# Patient Record
Sex: Female | Born: 1960 | Race: White | Hispanic: No
Health system: Southern US, Community
[De-identification: ages and names within clinical notes are randomized; demographics above are authoritative.]

## PROBLEM LIST (undated history)

## (undated) DIAGNOSIS — F0280 Dementia in other diseases classified elsewhere without behavioral disturbance: Secondary | ICD-10-CM

## (undated) DIAGNOSIS — R569 Unspecified convulsions: Secondary | ICD-10-CM

## (undated) DIAGNOSIS — E785 Hyperlipidemia, unspecified: Secondary | ICD-10-CM

## (undated) DIAGNOSIS — G309 Alzheimer's disease, unspecified: Secondary | ICD-10-CM

## (undated) HISTORY — PX: ABDOMINAL HYSTERECTOMY: SHX81

---

## 2006-10-21 ENCOUNTER — Encounter: Payer: Self-pay | Admitting: Unknown Physician Specialty

## 2006-11-06 ENCOUNTER — Encounter: Payer: Self-pay | Admitting: Unknown Physician Specialty

## 2006-12-07 ENCOUNTER — Encounter: Payer: Self-pay | Admitting: Unknown Physician Specialty

## 2007-01-06 ENCOUNTER — Encounter: Payer: Self-pay | Admitting: Unknown Physician Specialty

## 2011-07-22 ENCOUNTER — Ambulatory Visit: Payer: Self-pay | Admitting: Internal Medicine

## 2012-03-06 ENCOUNTER — Ambulatory Visit: Payer: Self-pay | Admitting: Internal Medicine

## 2013-09-17 ENCOUNTER — Ambulatory Visit: Payer: Self-pay | Admitting: Family Medicine

## 2013-10-01 ENCOUNTER — Ambulatory Visit: Payer: Self-pay | Admitting: Family Medicine

## 2013-10-11 DIAGNOSIS — E785 Hyperlipidemia, unspecified: Secondary | ICD-10-CM | POA: Insufficient documentation

## 2014-03-28 ENCOUNTER — Ambulatory Visit: Payer: Self-pay | Admitting: Gastroenterology

## 2014-03-29 LAB — PATHOLOGY REPORT

## 2014-05-06 ENCOUNTER — Ambulatory Visit: Payer: Self-pay | Admitting: Neurology

## 2015-03-14 ENCOUNTER — Other Ambulatory Visit: Payer: Self-pay | Admitting: Family Medicine

## 2015-03-14 DIAGNOSIS — Z1231 Encounter for screening mammogram for malignant neoplasm of breast: Secondary | ICD-10-CM

## 2015-03-15 ENCOUNTER — Ambulatory Visit
Admission: RE | Admit: 2015-03-15 | Discharge: 2015-03-15 | Disposition: A | Discharge status: Home / Self Care | Payer: No Typology Code available for payment source | Source: Ambulatory Visit | Attending: Family Medicine | Admitting: Family Medicine

## 2015-03-15 DIAGNOSIS — Z1231 Encounter for screening mammogram for malignant neoplasm of breast: Secondary | ICD-10-CM | POA: Insufficient documentation

## 2016-04-03 ENCOUNTER — Other Ambulatory Visit: Payer: Self-pay | Admitting: Obstetrics and Gynecology

## 2016-04-03 DIAGNOSIS — Z1231 Encounter for screening mammogram for malignant neoplasm of breast: Secondary | ICD-10-CM

## 2016-04-08 ENCOUNTER — Ambulatory Visit
Admission: RE | Admit: 2016-04-08 | Discharge: 2016-04-08 | Disposition: A | Discharge status: Home / Self Care | Payer: BLUE CROSS/BLUE SHIELD | Source: Ambulatory Visit | Attending: Obstetrics and Gynecology | Admitting: Obstetrics and Gynecology

## 2016-04-08 DIAGNOSIS — Z1231 Encounter for screening mammogram for malignant neoplasm of breast: Secondary | ICD-10-CM | POA: Insufficient documentation

## 2016-04-08 DIAGNOSIS — R928 Other abnormal and inconclusive findings on diagnostic imaging of breast: Secondary | ICD-10-CM | POA: Insufficient documentation

## 2018-07-26 ENCOUNTER — Emergency Department: Payer: Medicare PPO

## 2018-07-26 ENCOUNTER — Emergency Department
Admission: EM | Admit: 2018-07-26 | Discharge: 2018-07-26 | Disposition: A | Discharge status: Other Acute Inpatient Hospital | Payer: Medicare PPO | Attending: Emergency Medicine | Admitting: Emergency Medicine

## 2018-07-26 DIAGNOSIS — R569 Unspecified convulsions: Secondary | ICD-10-CM | POA: Insufficient documentation

## 2018-07-26 DIAGNOSIS — F0390 Unspecified dementia without behavioral disturbance: Secondary | ICD-10-CM | POA: Insufficient documentation

## 2018-07-26 DIAGNOSIS — G309 Alzheimer's disease, unspecified: Secondary | ICD-10-CM | POA: Diagnosis not present

## 2018-07-26 DIAGNOSIS — Z79899 Other long term (current) drug therapy: Secondary | ICD-10-CM | POA: Insufficient documentation

## 2018-07-26 HISTORY — DX: Dementia in other diseases classified elsewhere, unspecified severity, without behavioral disturbance, psychotic disturbance, mood disturbance, and anxiety: F02.80

## 2018-07-26 HISTORY — DX: Hyperlipidemia, unspecified: E78.5

## 2018-07-26 HISTORY — DX: Alzheimer's disease, unspecified: G30.9

## 2018-07-26 LAB — CBC WITH DIFFERENTIAL/PLATELET
ABS IMMATURE GRANULOCYTES: 0.05 10*3/uL (ref 0.00–0.07)
BASOS ABS: 0 10*3/uL (ref 0.0–0.1)
BASOS PCT: 0 %
EOS ABS: 0.1 10*3/uL (ref 0.0–0.5)
EOS PCT: 2 %
HEMATOCRIT: 43.2 % (ref 36.0–46.0)
HEMOGLOBIN: 14.1 g/dL (ref 12.0–15.0)
IMMATURE GRANULOCYTES: 1 %
Lymphocytes Relative: 31 %
Lymphs Abs: 1.8 10*3/uL (ref 0.7–4.0)
MCH: 30.1 pg (ref 26.0–34.0)
MCHC: 32.6 g/dL (ref 30.0–36.0)
MCV: 92.3 fL (ref 80.0–100.0)
MONO ABS: 0.4 10*3/uL (ref 0.1–1.0)
Monocytes Relative: 7 %
NRBC: 0 % (ref 0.0–0.2)
Neutro Abs: 3.3 10*3/uL (ref 1.7–7.7)
Neutrophils Relative %: 59 %
Platelets: 193 10*3/uL (ref 150–400)
RBC: 4.68 MIL/uL (ref 3.87–5.11)
RDW: 11.9 % (ref 11.5–15.5)
WBC: 5.7 10*3/uL (ref 4.0–10.5)

## 2018-07-26 LAB — URINALYSIS, COMPLETE (UACMP) WITH MICROSCOPIC
Bacteria, UA: NONE SEEN
Bilirubin Urine: NEGATIVE
GLUCOSE, UA: NEGATIVE mg/dL
KETONES UR: NEGATIVE mg/dL
Leukocytes, UA: NEGATIVE
Nitrite: NEGATIVE
PROTEIN: NEGATIVE mg/dL
Specific Gravity, Urine: 1.011 (ref 1.005–1.030)
pH: 5 (ref 5.0–8.0)

## 2018-07-26 LAB — COMPREHENSIVE METABOLIC PANEL
ALBUMIN: 4 g/dL (ref 3.5–5.0)
ALT: 16 U/L (ref 0–44)
AST: 32 U/L (ref 15–41)
Alkaline Phosphatase: 54 U/L (ref 38–126)
Anion gap: 10 (ref 5–15)
BILIRUBIN TOTAL: 0.7 mg/dL (ref 0.3–1.2)
BUN: 14 mg/dL (ref 6–20)
CO2: 21 mmol/L — ABNORMAL LOW (ref 22–32)
CREATININE: 0.91 mg/dL (ref 0.44–1.00)
Calcium: 9 mg/dL (ref 8.9–10.3)
Chloride: 107 mmol/L (ref 98–111)
GFR calc Af Amer: >60 mL/min (ref >60)
GLUCOSE: 115 mg/dL — AB (ref 70–99)
POTASSIUM: 4.5 mmol/L (ref 3.5–5.1)
Sodium: 138 mmol/L (ref 135–145)
TOTAL PROTEIN: 7.2 g/dL (ref 6.5–8.1)

## 2018-07-26 MED ORDER — LEVETIRACETAM IN NACL 1500 MG/100ML IV SOLN
1500.0000 mg | Freq: Once | INTRAVENOUS | Status: AC
  Administered 2018-07-26: 1500 mg via INTRAVENOUS
  Filled 2018-07-26: qty 100

## 2018-07-26 MED ORDER — LEVETIRACETAM 500 MG PO TABS: 500.0000 mg | ORAL_TABLET | Freq: Two times a day (BID) | ORAL | 0 refills | Status: AC

## 2018-07-26 NOTE — ED Notes (Signed)
Pt resting at this time  Family remains at bedside

## 2018-07-26 NOTE — ED Triage Notes (Signed)
Patient coming from [facility] for unwitnessed fall. Patient found on floor, convulsing, foaming from mouth, blood present in mouth per facility. EMS did not visualize blood in mouth, however found it on pillow. Patient has hx of early onset alzheimer's.   EMS vitals: CBG 144, 124/70

## 2018-07-26 NOTE — ED Notes (Signed)
Patient very upset continues to cry and state she is scared and sorry.AS

## 2018-07-26 NOTE — ED Notes (Signed)
Report given to mebane ridge at this time regarding new medication and hospital visit.

## 2018-07-26 NOTE — Discharge Instructions (Signed)
Please begin taking Keppra twice a day as prescribed and make an appointment to follow-up with your neurologist within 1 week for recheck.  Return to the emergency department sooner for any concerns.  It was a pleasure to take care of you today, and thank you for coming to our emergency department.  If you have any questions or concerns before leaving please ask the nurse to grab me and I'm more than happy to go through your aftercare instructions again.  If you have any concerns once you are home that you are not improving or are in fact getting worse before you can make it to your follow-up appointment, please do not hesitate to call 911 and come back for further evaluation.  Merrily Brittle, MD  Results for orders placed or performed during the hospital encounter of 07/26/18  CBC with Differential  Result Value Ref Range   WBC 5.7 4.0 - 10.5 K/uL   RBC 4.68 3.87 - 5.11 MIL/uL   Hemoglobin 14.1 12.0 - 15.0 g/dL   HCT 16.0 10.9 - 32.3 %   MCV 92.3 80.0 - 100.0 fL   MCH 30.1 26.0 - 34.0 pg   MCHC 32.6 30.0 - 36.0 g/dL   RDW 55.7 32.2 - 02.5 %   Platelets 193 150 - 400 K/uL   nRBC 0.0 0.0 - 0.2 %   Neutrophils Relative % 59 %   Neutro Abs 3.3 1.7 - 7.7 K/uL   Lymphocytes Relative 31 %   Lymphs Abs 1.8 0.7 - 4.0 K/uL   Monocytes Relative 7 %   Monocytes Absolute 0.4 0.1 - 1.0 K/uL   Eosinophils Relative 2 %   Eosinophils Absolute 0.1 0.0 - 0.5 K/uL   Basophils Relative 0 %   Basophils Absolute 0.0 0.0 - 0.1 K/uL   Immature Granulocytes 1 %   Abs Immature Granulocytes 0.05 0.00 - 0.07 K/uL  Comprehensive metabolic panel  Result Value Ref Range   Sodium 138 135 - 145 mmol/L   Potassium 4.5 3.5 - 5.1 mmol/L   Chloride 107 98 - 111 mmol/L   CO2 21 (L) 22 - 32 mmol/L   Glucose, Bld 115 (H) 70 - 99 mg/dL   BUN 14 6 - 20 mg/dL   Creatinine, Ser 4.27 0.44 - 1.00 mg/dL   Calcium 9.0 8.9 - 06.2 mg/dL   Total Protein 7.2 6.5 - 8.1 g/dL   Albumin 4.0 3.5 - 5.0 g/dL   AST 32 15 - 41 U/L     ALT 16 0 - 44 U/L   Alkaline Phosphatase 54 38 - 126 U/L   Total Bilirubin 0.7 0.3 - 1.2 mg/dL   GFR calc non Af Amer >60 >60 mL/min   GFR calc Af Amer >60 >60 mL/min   Anion gap 10 5 - 15  Urinalysis, Complete w Microscopic  Result Value Ref Range   Color, Urine YELLOW (A) YELLOW   APPearance CLOUDY (A) CLEAR   Specific Gravity, Urine 1.011 1.005 - 1.030   pH 5.0 5.0 - 8.0   Glucose, UA NEGATIVE NEGATIVE mg/dL   Hgb urine dipstick SMALL (A) NEGATIVE   Bilirubin Urine NEGATIVE NEGATIVE   Ketones, ur NEGATIVE NEGATIVE mg/dL   Protein, ur NEGATIVE NEGATIVE mg/dL   Nitrite NEGATIVE NEGATIVE   Leukocytes, UA NEGATIVE NEGATIVE   RBC / HPF 0-5 0 - 5 RBC/hpf   WBC, UA 0-5 0 - 5 WBC/hpf   Bacteria, UA NONE SEEN NONE SEEN   Squamous Epithelial / LPF 6-10 0 -  5   Hyaline Casts, UA PRESENT    Ct Head Wo Contrast  Result Date: 07/26/2018 CLINICAL DATA:  Initial evaluation for acute seizure. EXAM: CT HEAD WITHOUT CONTRAST TECHNIQUE: Contiguous axial images were obtained from the base of the skull through the vertex without intravenous contrast. COMPARISON:  Prior MRI from 05/06/2014. FINDINGS: Brain: Mildly advanced age-related cerebral atrophy. No acute intracranial hemorrhage. No acute large vessel territory infarct. No mass lesion, midline shift or mass effect. Prominent ventriculomegaly involving the lateral ventricles, mildly worsened relative to previous brain MRI from 2015, and somewhat out of proportion to cortical sulcation. Third and fourth ventricles relatively normal in size. No obstructive lesion identified. Periventricular hypodensity could reflect chronic microvascular ischemic disease and/or transependymal flow of CSF. Vascular: No hyperdense vessel. Scattered vascular calcifications noted within the carotid siphons. Skull: Scalp soft tissues and calvarium within normal limits. Sinuses/Orbits: Globes and orbital soft tissues within normal limits. Paranasal sinuses are clear. Trace  bilateral mastoid effusions, of doubtful significance. Other: None. IMPRESSION: 1. Ventriculomegaly, somewhat out of proportion relative to cortical sulcation, and mildly worsened relative to previous brain MRI from 2015. While this finding could be related to underlying cerebral atrophy, NPH could also have this appearance. Sequelae of Alzheimer's dementia could also be considered. Periventricular hypodensity could reflect sequelae of chronic microvascular ischemic disease and/or transependymal flow of CSF. 2. No other acute intracranial abnormality. Electronically Signed   By: Rise MuBenjamin  McClintock M.D.   On: 07/26/2018 04:41   Dg Chest Port 1 View  Result Date: 07/26/2018 CLINICAL DATA:  Initial evaluation for acute shortness of breath. EXAM: PORTABLE CHEST 1 VIEW COMPARISON:  None available. FINDINGS: Mild cardiomegaly.  Mediastinal silhouette normal. Lungs mildly hypoinflated. Perihilar vascular congestion without overt pulmonary edema. No consolidative opacity. No pleural effusion. No pneumothorax. No acute osseous finding. IMPRESSION: 1. Cardiomegaly with mild perihilar vascular congestion without overt edema. 2. No other active cardiopulmonary disease. Electronically Signed   By: Rise MuBenjamin  McClintock M.D.   On: 07/26/2018 04:09

## 2018-07-26 NOTE — ED Notes (Signed)
Patient transported to CT 

## 2018-07-26 NOTE — ED Provider Notes (Addendum)
[facility] Emergency Department Provider Note  ____________________________________________   First MD Initiated Contact with Patient 07/26/18 0354     (approximate)  I have reviewed the triage vital signs and the nursing notes.   HISTORY  Chief Complaint Fall  Level 5 exemption history is limited by the patient's dementia  HPI Christine Oconnell is a 58 y.o. female comes to the emergency department via EMS after apparently having a first lifetime generalized tonic-clonic seizure shortly prior to arrival.  History obtained from the patient's son whose girlfriend was present at the time of the event.  The patient has a long history of early onset dementia and has never had a seizure in her life.  Apparently this evening she got up to go to the bathroom and while in the bathroom had a generalized tonic-clonic seizure and "foaming at the mouth" and was "out of it" for 15 to 20 minutes.  EMS noted normal blood sugar in route and according to family the patient is currently behaving normally.    Past Medical History:  Diagnosis Date  . Alzheimer's dementia (HCC)   . Hyperlipidemia     There are no active problems to display for this patient.   Past Surgical History:  Procedure Laterality Date  . ABDOMINAL HYSTERECTOMY      Prior to Admission medications   Medication Sig Start Date End Date Taking? Authorizing Provider  ascorbic acid (VITAMIN C) 250 MG CHEW Chew 500 mg by mouth daily.    [provider]  escitalopram (LEXAPRO) 10 MG tablet Take 10 mg by mouth daily.  07/07/18   [provider]  levETIRAcetam (KEPPRA) 500 MG tablet Take 1 tablet (500 mg total) by mouth 2 (two) times daily. 07/26/18   Merrily Brittle, MD  lovastatin (MEVACOR) 20 MG tablet Take 20 mg by mouth daily at 6 PM.  06/01/18   [provider]  memantine (NAMENDA) 10 MG tablet Take 10 mg by mouth 2 (two) times daily.  05/04/18   [provider]    QUEtiapine (SEROQUEL) 25 MG tablet Take 25 mg by mouth 2 (two) times daily.  06/04/18   [provider]  vitamin B-12 (CYANOCOBALAMIN) 1000 MCG tablet Take 1,000 mcg by mouth daily.     [provider]    Allergies Patient has no known allergies.  Family History  Problem Relation Age of Onset  . Breast cancer Maternal Grandmother 85       mat great gm  . Breast cancer Cousin 92    Social History Social History   Tobacco Use  . Smoking status: Unknown If Ever Smoked  Substance Use Topics  . Alcohol use: Not Currently  . Drug use: Not on file    Review of Systems Level 5 exemption history is limited by the patient's profound dementia  ____________________________________________   PHYSICAL EXAM:  VITAL SIGNS: ED Triage Vitals  Enc Vitals Group     BP      Pulse      Resp      Temp      Temp src      SpO2      Weight      Height      Head Circumference      Peak Flow      Pain Score      Pain Loc      Pain Edu?      Excl. in GC?     Constitutional: Pleasant  and cooperative.  Appears somewhat confused with profound dementia Eyes: PERRL EOMI. midrange and brisk Head: Atraumatic. Nose: No congestion/rhinnorhea. Mouth/Throat: No trismus Neck: No stridor.  No meningismus Cardiovascular: Normal rate, regular rhythm. Grossly normal heart sounds.  Good peripheral circulation. Respiratory: Slightly increased respiratory effort.  No retractions. Lungs CTAB and moving good air Gastrointestinal: Soft nontender Musculoskeletal: No lower extremity edema   Neurologic:  No gross focal neurologic deficits are appreciated. Skin:  Skin is warm, dry and intact. No rash noted.  No diaphoresis Psychiatric: Profound dementia    ____________________________________________   DIFFERENTIAL includes but not limited to  Intracerebral hemorrhage, stroke, nonaccidental trauma, dehydration, metabolic derangement, arrhythmia, urinary tract  infection ____________________________________________   LABS (all labs ordered are listed, but only abnormal results are displayed)  Labs Reviewed  COMPREHENSIVE METABOLIC PANEL - Abnormal; Notable for the following components:      Result Value   CO2 21 (*)    Glucose, Bld 115 (*)    All other components within normal limits  URINALYSIS, COMPLETE (UACMP) WITH MICROSCOPIC - Abnormal; Notable for the following components:   Color, Urine YELLOW (*)    APPearance CLOUDY (*)    Hgb urine dipstick SMALL (*)    All other components within normal limits  CBC WITH DIFFERENTIAL/PLATELET    Lab work reviewed by me with no clear etiology of the patient's symptoms identified __________________________________________  EKG  ED ECG REPORT I, Merrily BrittleNeil Sindy Mccune, the attending physician, personally viewed and interpreted this ECG.  Date: 07/26/2018 EKG Time:  Rate: 65 Rhythm: normal sinus rhythm QRS Axis: normal Intervals: normal ST/T Wave abnormalities: normal Narrative Interpretation: no evidence of acute ischemia  ____________________________________________  RADIOLOGY  Chest x-ray reviewed by me shows cardiomegaly otherwise unremarkable CT scan of the head reviewed by me shows slightly increased ventriculomegaly compared to MRI in 2015 ____________________________________________   PROCEDURES  Procedure(s) performed: no  .Critical Care Performed by: Merrily Brittleifenbark, Nakayla Rorabaugh, MD Authorized by: Merrily Brittleifenbark, Mehtaab Mayeda, MD   Critical care provider statement:    Critical care time (minutes):  30   Critical care time was exclusive of:  Separately billable procedures and treating other patients   Critical care was necessary to treat or prevent imminent or life-threatening deterioration of the following conditions:  CNS failure or compromise   Critical care was time spent personally by me on the following activities:  Development of treatment plan with patient or surrogate, discussions with  consultants, evaluation of patient's response to treatment, examination of patient, obtaining history from patient or surrogate, ordering and performing treatments and interventions, ordering and review of laboratory studies, ordering and review of radiographic studies, pulse oximetry, re-evaluation of patient's condition and review of old charts    Critical Care performed: no  ____________________________________________   INITIAL IMPRESSION / ASSESSMENT AND PLAN / ED COURSE  Pertinent labs & imaging results that were available during my care of the patient were reviewed by me and considered in my medical decision making (see chart for details).   As part of my medical decision making, I reviewed the following data within the electronic MEDICAL RECORD NUMBER History obtained from family if available, nursing notes, old chart and ekg, as well as notes from prior ED visits.  The patient comes to the emergency department after a first lifetime generalized tonic-clonic seizure.  On discussion it sounds like the patient's full body convulsed she was obtunded and had a postictal period.  As this is her first lifetime seizure I will add on a head  CT in addition to labs including urinalysis.  I did consider arrhythmia such as ventricular tachycardia and will keep her on monitor for several hours to observe.  EKG with no concerning signs.  The patient's head CT shows increased ventriculomegaly compared to previous concerning for possible normal pressure hydrocephalus versus worsening dementia.  I discussed with [facility] neurologist (family request) Dr. Regino Schultze and we reviewed the patient's CT read in her presentation.  He felt that her last neuroimaging was 5 years ago and the changes present likely represent continued deterioration and not normal pressure hydrocephalus.  I told him I did load the patient on Keppra which she felt was reasonable and felt that it would be reasonable to either  start her on Keppra or not.  Given the patient's dementia and new symptoms I have opted to start on Keppra prescribed a one-month supply and will refer her back to Dr. Sherryll Burger for continued outpatient management.  The patient is currently at her baseline and family understands and is very comfortable with the plan.  Return precautions have been given.      ____________________________________________   FINAL CLINICAL IMPRESSION(S) / ED DIAGNOSES  Final diagnoses:  Seizure (HCC)      NEW MEDICATIONS STARTED DURING THIS VISIT:  Discharge Medication List as of 07/26/2018  6:35 AM    START taking these medications   Details  levETIRAcetam (KEPPRA) 500 MG tablet Take 1 tablet (500 mg total) by mouth 2 (two) times daily., Starting Sun 07/26/2018, Print         Note:  This document was prepared using Dragon voice recognition software and may include unintentional dictation errors.    Merrily Brittle, MD 07/26/18 2023    Merrily Brittle, MD 08/02/18 [phone]

## 2018-08-22 ENCOUNTER — Other Ambulatory Visit: Payer: Self-pay

## 2018-08-22 ENCOUNTER — Ambulatory Visit
Admission: EM | Admit: 2018-08-22 | Discharge: 2018-08-22 | Disposition: A | Discharge status: Home / Self Care | Payer: Medicare PPO | Attending: Emergency Medicine | Admitting: Emergency Medicine

## 2018-08-22 ENCOUNTER — Encounter: Payer: Self-pay | Admitting: Gynecology

## 2018-08-22 DIAGNOSIS — R451 Restlessness and agitation: Secondary | ICD-10-CM

## 2018-08-22 DIAGNOSIS — N898 Other specified noninflammatory disorders of vagina: Secondary | ICD-10-CM

## 2018-08-22 LAB — URINALYSIS, COMPLETE (UACMP) WITH MICROSCOPIC
Bilirubin Urine: NEGATIVE
GLUCOSE, UA: NEGATIVE mg/dL
Ketones, ur: NEGATIVE mg/dL
Leukocytes,Ua: NEGATIVE
Nitrite: NEGATIVE
PH: 5.5 (ref 5.0–8.0)
PROTEIN: NEGATIVE mg/dL
Specific Gravity, Urine: 1.025 (ref 1.005–1.030)

## 2018-08-22 MED ORDER — FLUCONAZOLE 150 MG PO TABS: 150.0000 mg | ORAL_TABLET | Freq: Once | ORAL | 0 refills | Status: AC

## 2018-08-22 NOTE — ED Triage Notes (Signed)
Per mother , daughter possible uti.

## 2018-08-22 NOTE — Discharge Instructions (Addendum)
She may have a yeast infection causing her agitation, will try the Diflucan.  she needs to be in clean, dry depends- check frequently to make sure that she is not sitting in a wet diaper.

## 2018-08-22 NOTE — ED Provider Notes (Signed)
HPI  SUBJECTIVE:  Christine Oconnell is a 58 y.o. female who presents with two episodes of violent agitation this morning while nursing home aides were trying to clean her after patient had  a bowel movement.  mother states that the patient has been doing this for a long time.  No other change in her behavior, no vomiting, fevers.  Patient is eating and drinking well.  No medical, back pain, no pain with defecation.  No melena, hematochezia.  Patient wears depends, she is not able to tell when she needs to urinate, she does not use the restroom to urinate.  Mother states that she often finds the patient sitting in a wet diaper.  When mother cleaned the patient today she did not notice any perianal or vaginal irritation.  No odorous urine, hematuria.  No apparent vaginal itching or scratching.  Mother reports vaginal discharge.  Patient was treated for UTI 10 days ago with Bactrim for 5 days.  Last dose was on 2/11.  No other antibiotics in the past month.  Past medical history of early onset Alzheimer's dementia, hyperlipidemia.  She is in a nursing home.  Patient is verbal, but does not respond to questions appropriately.  All history obtained from the mother.  Patient is at her baseline mental status per mother.  Past Medical History:  Diagnosis Date  . Alzheimer's dementia (HCC)   . Hyperlipidemia     Past Surgical History:  Procedure Laterality Date  . ABDOMINAL HYSTERECTOMY      Family History  Problem Relation Age of Onset  . Breast cancer Maternal Grandmother 85       mat great gm  . Breast cancer Cousin 70    Social History   Tobacco Use  . Smoking status: Unknown If Ever Smoked  . Smokeless tobacco: Never Used  Substance Use Topics  . Alcohol use: Not Currently  . Drug use: Never    No current facility-administered medications for this encounter.   Current Outpatient Medications:  .  ascorbic acid (VITAMIN C) 250 MG CHEW, Chew 500 mg by mouth daily., Disp: , Rfl:  .   escitalopram (LEXAPRO) 10 MG tablet, Take 10 mg by mouth daily. , Disp: , Rfl:  .  levETIRAcetam (KEPPRA) 500 MG tablet, Take 1 tablet (500 mg total) by mouth 2 (two) times daily., Disp: 60 tablet, Rfl: 0 .  lovastatin (MEVACOR) 20 MG tablet, Take 20 mg by mouth daily at 6 PM. , Disp: , Rfl:  .  memantine (NAMENDA) 10 MG tablet, Take 10 mg by mouth 2 (two) times daily. , Disp: , Rfl:  .  QUEtiapine (SEROQUEL) 25 MG tablet, Take 25 mg by mouth 2 (two) times daily. , Disp: , Rfl:  .  fluconazole (DIFLUCAN) 150 MG tablet, Take 1 tablet (150 mg total) by mouth once for 1 dose. 1 tab po x 1. May repeat in 72 hours if no improvement, Disp: 2 tablet, Rfl: 0 .  vitamin B-12 (CYANOCOBALAMIN) 1000 MCG tablet, Take 1,000 mcg by mouth daily. , Disp: , Rfl:   No Known Allergies   ROS  As noted in HPI.   Physical Exam  BP 130/88 (BP Location: Left Arm)   Pulse 64   Temp 97.9 F (36.6 C) (Oral)   Resp 16   Wt 77.1 kg   SpO2 100%   Constitutional: Well developed, well nourished, no acute distress Eyes: PERRL, EOMI, conjunctiva normal bilaterally HENT: Normocephalic, atraumatic,mucus membranes moist Respiratory: Clear to auscultation bilaterally,  no rales, no wheezing, no rhonchi Cardiovascular: Normal rate and rhythm, no murmurs, no gallops, no rubs GI: Soft, nondistended, normal bowel sounds, nontender, no rebound, no guarding Back: no CVAT skin: No rash, skin intact Musculoskeletal: No edema, no tenderness, no deformities Neurologic: Alert & oriented x 3, CN II-XII grossly intact, no motor deficits, sensation grossly intact Psychiatric: behavior appropriate.  Calm.  Responds to simple commands.  Does not respond to questions appropriately.   ED Course   Medications - No data to display  Orders Placed This Encounter  Procedures  . Urine culture    Standing Status:   Standing    Number of Occurrences:   1  . Urinalysis, Complete w Microscopic    Standing Status:   Standing    Number  of Occurrences:   1   Results for orders placed or performed during the hospital encounter of 08/22/18 (from the past 24 hour(s))  Urinalysis, Complete w Microscopic     Status: Abnormal   Collection Time: 08/22/18  1:02 PM  Result Value Ref Range   Color, Urine YELLOW YELLOW   APPearance CLEAR CLEAR   Specific Gravity, Urine 1.025 1.005 - 1.030   pH 5.5 5.0 - 8.0   Glucose, UA NEGATIVE NEGATIVE mg/dL   Hgb urine dipstick SMALL (A) NEGATIVE   Bilirubin Urine NEGATIVE NEGATIVE   Ketones, ur NEGATIVE NEGATIVE mg/dL   Protein, ur NEGATIVE NEGATIVE mg/dL   Nitrite NEGATIVE NEGATIVE   Leukocytes,Ua NEGATIVE NEGATIVE   Squamous Epithelial / LPF 6-10 0 - 5   WBC, UA 0-5 0 - 5 WBC/hpf   RBC / HPF 0-5 0 - 5 RBC/hpf   Bacteria, UA RARE (A) NONE SEEN   No results found.  ED Clinical Impression  Agitation   ED Assessment/Plan  Unsure as to what caused the patient's outburst this morning, mother states that she frequently has these outbursts when the patient is being cleaned after having a bowel movement.  She may have a yeast vaginitis causing irritation, especially as she just finished some Bactrim for a UTI, and is often in wet depends.  Mother does note vaginal discharge.  Do not think that we will be able to perform a GU exam or get a sample here in clinic for testing, so we will treat empirically with Diflucan.  No evidence of an emergency at this point in time.  She is calm, cooperative, has normal vitals.  Her urine is initially negative for UTI, has a few bacteria and small hematuria, but will send this off for culture to confirm absence of UTI.  Discussed labs,  MDM, treatment plan, and plan for follow-up with parent Discussed sn/sx that should prompt return to the ED. parent agrees with plan.   Meds ordered this encounter  Medications  . fluconazole (DIFLUCAN) 150 MG tablet    Sig: Take 1 tablet (150 mg total) by mouth once for 1 dose. 1 tab po x 1. May repeat in 72 hours if no  improvement    Dispense:  2 tablet    Refill:  0    *This clinic note was created using Scientist, clinical (histocompatibility and immunogenetics). Therefore, there may be occasional mistakes despite careful proofreading.  ?   Domenick Gong, MD 08/22/18 [phone]

## 2018-08-24 LAB — URINE CULTURE: Culture: <10000 — AB

## 2018-09-17 ENCOUNTER — Other Ambulatory Visit: Payer: Self-pay

## 2018-09-17 ENCOUNTER — Emergency Department
Admission: EM | Admit: 2018-09-17 | Discharge: 2018-09-17 | Disposition: A | Discharge status: Home / Self Care | Payer: Medicare PPO | Attending: Emergency Medicine | Admitting: Emergency Medicine

## 2018-09-17 DIAGNOSIS — R451 Restlessness and agitation: Secondary | ICD-10-CM | POA: Diagnosis present

## 2018-09-17 DIAGNOSIS — F0391 Unspecified dementia with behavioral disturbance: Secondary | ICD-10-CM | POA: Diagnosis not present

## 2018-09-17 DIAGNOSIS — Z79899 Other long term (current) drug therapy: Secondary | ICD-10-CM | POA: Insufficient documentation

## 2018-09-17 LAB — COMPREHENSIVE METABOLIC PANEL
ALBUMIN: 4.1 g/dL (ref 3.5–5.0)
ALT: 19 U/L (ref 0–44)
AST: 26 U/L (ref 15–41)
Alkaline Phosphatase: 38 U/L (ref 38–126)
Anion gap: 6 (ref 5–15)
BUN: 16 mg/dL (ref 6–20)
CALCIUM: 9.2 mg/dL (ref 8.9–10.3)
CHLORIDE: 110 mmol/L (ref 98–111)
CO2: 27 mmol/L (ref 22–32)
Creatinine, Ser: 0.94 mg/dL (ref 0.44–1.00)
GFR calc Af Amer: >60 mL/min (ref >60)
GFR calc non Af Amer: >60 mL/min (ref >60)
GLUCOSE: 104 mg/dL — AB (ref 70–99)
POTASSIUM: 4.1 mmol/L (ref 3.5–5.1)
SODIUM: 143 mmol/L (ref 135–145)
TOTAL PROTEIN: 7.1 g/dL (ref 6.5–8.1)
Total Bilirubin: 0.6 mg/dL (ref 0.3–1.2)

## 2018-09-17 LAB — CBC
HCT: 41.9 % (ref 36.0–46.0)
Hemoglobin: 13.5 g/dL (ref 12.0–15.0)
MCH: 29.1 pg (ref 26.0–34.0)
MCHC: 32.2 g/dL (ref 30.0–36.0)
MCV: 90.3 fL (ref 80.0–100.0)
NRBC: 0 % (ref 0.0–0.2)
PLATELETS: 207 10*3/uL (ref 150–400)
RBC: 4.64 MIL/uL (ref 3.87–5.11)
RDW: 12.1 % (ref 11.5–15.5)
WBC: 5.8 10*3/uL (ref 4.0–10.5)

## 2018-09-17 LAB — URINALYSIS, COMPLETE (UACMP) WITH MICROSCOPIC
Bacteria, UA: NONE SEEN
Bilirubin Urine: NEGATIVE
Glucose, UA: NEGATIVE mg/dL
Hgb urine dipstick: NEGATIVE
KETONES UR: NEGATIVE mg/dL
Leukocytes,Ua: NEGATIVE
Nitrite: NEGATIVE
PH: 6 (ref 5.0–8.0)
Protein, ur: NEGATIVE mg/dL
SPECIFIC GRAVITY, URINE: 1.02 (ref 1.005–1.030)

## 2018-09-17 NOTE — ED Triage Notes (Signed)
Pt arrived from [facility] via EMS with complaints of pt being combative per the facility. Pt has early onset dementia. VS per EMS BP-108/85 HR-75 CBG-119 NSR O2sat-95%RA Temp-97.5. The facility thinks she could possibly have a UTI. Pt has complaints of stomach pain.

## 2018-09-17 NOTE — ED Provider Notes (Signed)
[facility] Emergency Department Provider Note  Time seen: 9:24 PM  I have reviewed the triage vital signs and the nursing notes.   HISTORY  Chief Complaint Agitation   HPI CHANNEL TEE is a 58 y.o. female with a past medical history of dementia, hyperlipidemia, presents to the emergency department for agitation at her nursing facility.  According to EMS report patient became agitated and aggressive at her nursing facility.  Per EMS staff states that patient will typically get like this when she has a urinary tract infection so they sent her to the emergency department for medical evaluation.  Currently the patient appears extremely well, she has baseline dementia cannot contribute to her history or review of systems however she is calm cooperative and pleasant at this time.  Past Medical History:  Diagnosis Date  . Alzheimer's dementia (HCC)   . Hyperlipidemia     There are no active problems to display for this patient.   Past Surgical History:  Procedure Laterality Date  . ABDOMINAL HYSTERECTOMY      Prior to Admission medications   Medication Sig Start Date End Date Taking? Authorizing Provider  ascorbic acid (VITAMIN C) 250 MG CHEW Chew 500 mg by mouth daily.    [provider]  escitalopram (LEXAPRO) 10 MG tablet Take 10 mg by mouth daily.  07/07/18   [provider]  levETIRAcetam (KEPPRA) 500 MG tablet Take 1 tablet (500 mg total) by mouth 2 (two) times daily. 07/26/18   Merrily Brittle, MD  lovastatin (MEVACOR) 20 MG tablet Take 20 mg by mouth daily at 6 PM.  06/01/18   [provider]  memantine (NAMENDA) 10 MG tablet Take 10 mg by mouth 2 (two) times daily.  05/04/18   [provider]  QUEtiapine (SEROQUEL) 25 MG tablet Take 25 mg by mouth 2 (two) times daily.  06/04/18   [provider]  vitamin B-12 (CYANOCOBALAMIN) 1000 MCG tablet Take 1,000 mcg by mouth daily.     [provider]    No  Known Allergies  Family History  Problem Relation Age of Onset  . Breast cancer Maternal Grandmother 85       mat great gm  . Breast cancer Cousin 88    Social History Social History   Tobacco Use  . Smoking status: Unknown If Ever Smoked  . Smokeless tobacco: Never Used  Substance Use Topics  . Alcohol use: Not Currently  . Drug use: Never    Review of Systems Unable to obtain an adequate/accurate review of systems secondary to baseline dementia.  ____________________________________________   PHYSICAL EXAM:  VITAL SIGNS: ED Triage Vitals  Enc Vitals Group     BP 09/17/18 2055 120/71     Pulse Rate 09/17/18 2055 77     Resp 09/17/18 2055 16     Temp 09/17/18 2055 99 F (37.2 C)     Temp Source 09/17/18 2055 Oral     SpO2 09/17/18 2055 97 %     Weight 09/17/18 2058 169 lb 15.6 oz (77.1 kg)     Height 09/17/18 2058 5\' 5"  (1.651 m)     Head Circumference --      Peak Flow --      Pain Score 09/17/18 2057 1     Pain Loc --      Pain Edu? --      Excl. in GC? --    Constitutional: Awake and alert, no distress.  Calm and cooperative.  Pleasant currently. Eyes: Normal exam ENT   Head: Normocephalic and atraumatic.   Mouth/Throat: Mucous membranes are moist. Cardiovascular: Normal rate, regular rhythm. Respiratory: Normal respiratory effort without tachypnea nor retractions. Breath sounds are clear  Gastrointestinal: Soft and nontender. No distention.   Musculoskeletal: Nontender with normal range of motion in all extremities.  Neurologic:  Normal speech and language. No gross focal neurologic deficits Skin:  Skin is warm, dry and intact.  Psychiatric: Mood and affect are normal.     INITIAL IMPRESSION / ASSESSMENT AND PLAN / ED COURSE  Pertinent labs & imaging results that were available during my care of the patient were reviewed by me and considered in my medical decision making (see chart for details).  Patient presents to the emergency department  for agitation/aggression at her nursing facility today.  They state she is acted similarly in the past with urinary tract infections and sent her to the emergency department for evaluation.  Here the patient is calm cooperative and pleasant.  She has dementia and cannot contribute to her history or review of systems.  We will check basic labs as well as a urine sample.  Overall patient appears extremely well and would appear safe for discharge back to her nursing facility.  Patient's work-up is essentially negative.  We will discharge patient back to her nursing facility.  Patient continues to be calm and cooperative.  ____________________________________________   FINAL CLINICAL IMPRESSION(S) / ED DIAGNOSES  Aggressive behavior Dementia   Minna Antis, MD 09/17/18 2214

## 2018-09-18 ENCOUNTER — Emergency Department
Admission: EM | Admit: 2018-09-18 | Discharge: 2018-09-22 | Disposition: A | Discharge status: Home / Self Care | Payer: Medicare PPO | Attending: Emergency Medicine | Admitting: Emergency Medicine

## 2018-09-18 ENCOUNTER — Other Ambulatory Visit: Payer: Self-pay

## 2018-09-18 DIAGNOSIS — G300 Alzheimer's disease with early onset: Secondary | ICD-10-CM | POA: Diagnosis present

## 2018-09-18 DIAGNOSIS — Z79899 Other long term (current) drug therapy: Secondary | ICD-10-CM | POA: Diagnosis not present

## 2018-09-18 DIAGNOSIS — R4689 Other symptoms and signs involving appearance and behavior: Secondary | ICD-10-CM | POA: Diagnosis not present

## 2018-09-18 DIAGNOSIS — G309 Alzheimer's disease, unspecified: Secondary | ICD-10-CM

## 2018-09-18 DIAGNOSIS — F0281 Dementia in other diseases classified elsewhere with behavioral disturbance: Secondary | ICD-10-CM | POA: Diagnosis present

## 2018-09-18 DIAGNOSIS — F0280 Dementia in other diseases classified elsewhere without behavioral disturbance: Secondary | ICD-10-CM | POA: Diagnosis present

## 2018-09-18 MED ORDER — LORAZEPAM 2 MG/ML IJ SOLN
2.0000 mg | Freq: Once | INTRAMUSCULAR | Status: AC
  Administered 2018-09-18: 2 mg via INTRAMUSCULAR

## 2018-09-18 MED ORDER — LORAZEPAM 1 MG PO TABS
1.0000 mg | ORAL_TABLET | Freq: Once | ORAL | Status: DC
  Filled 2018-09-18: qty 1

## 2018-09-18 MED ORDER — HALOPERIDOL LACTATE 5 MG/ML IJ SOLN
5.0000 mg | Freq: Once | INTRAMUSCULAR | Status: AC
  Administered 2018-09-18: 5 mg via INTRAMUSCULAR

## 2018-09-18 NOTE — ED Notes (Signed)
Pt sleeping; no distress noted

## 2018-09-18 NOTE — ED Notes (Signed)
Triage was NOT performed by this RN. Triage was completed by [facility] RN

## 2018-09-18 NOTE — ED Notes (Signed)
Pt's mother at bedside.

## 2018-09-18 NOTE — ED Notes (Signed)
Spoke with MD Fanny Bien; pt will see MD prior to collection of further data.

## 2018-09-18 NOTE — ED Triage Notes (Signed)
Pt arrives via ems from [facility] for aggressive behavior that the facility reported has increased in severity over the last few days. Per EMS report from facility pt has been throwing chairs and silverware at residents and staff.

## 2018-09-18 NOTE — ED Notes (Signed)
Pt's is resting with eyes closed no distress noted, pt's mother waiting n waiting room

## 2018-09-18 NOTE — ED Notes (Signed)
This RN talked to pt's mother Mrs. [name], reports she will be going home to rest but if anything needed to call her cell phone [phone], reports last week house MD from facility started pt on new medication but knows that facility has not been given medication to pt as prescribed, reports will have a meeting at facility on Monday to address concern

## 2018-09-18 NOTE — ED Provider Notes (Signed)
[facility] Emergency Department Provider Note   ____________________________________________   First MD Initiated Contact with Patient 09/18/18 1923     (approximate)  I have reviewed the triage vital signs and the nursing notes.   HISTORY  Chief Complaint Aggressive Behavior  History limited by dementia  HPI Christine Oconnell is a 58 y.o. female who has a history of premature senile dementia or Alzheimer's disease.  She has been living at home until January when she went to the nursing home.  Last few days she has been having aggressive behavior when she goes into the cafeteria to eat.  Seems like the noise in confusion makes her anxious and she gets aggressive.  Patient was seen here yesterday for the same thing.  Her doctor started her on something which she only got 1 dose earlier today.  She became very aggressive at the nursing home again.  Here she was calm cool and collected until another patient began yelling and screaming at which point this patient also began yelling and screaming.  We sedated her with Ativan and a little Haldol because she would not take anything by mouth.     Past Medical History:  Diagnosis Date  . Alzheimer's dementia (HCC)   . Hyperlipidemia     There are no active problems to display for this patient.   Past Surgical History:  Procedure Laterality Date  . ABDOMINAL HYSTERECTOMY      Prior to Admission medications   Medication Sig Start Date End Date Taking? Authorizing Provider  divalproex (DEPAKOTE SPRINKLE) 125 MG capsule Take 125 mg by mouth 2 (two) times daily. 09/05/18  Yes [provider]  escitalopram (LEXAPRO) 10 MG tablet Take 10 mg by mouth daily.  07/07/18  Yes [provider]  levETIRAcetam (KEPPRA) 500 MG tablet Take 1 tablet (500 mg total) by mouth 2 (two) times daily. 07/26/18  Yes Merrily Brittle, MD  lovastatin (MEVACOR) 20 MG tablet Take 20 mg by mouth daily at 6 PM.  06/01/18  Yes  [provider]  memantine (NAMENDA) 10 MG tablet Take 10 mg by mouth 2 (two) times daily.  05/04/18  Yes [provider]  QUEtiapine (SEROQUEL) 25 MG tablet Take 25 mg by mouth 2 (two) times daily.  06/04/18  Yes [provider]    Allergies Patient has no known allergies.  Family History  Problem Relation Age of Onset  . Breast cancer Maternal Grandmother 85       mat great gm  . Breast cancer Cousin 55    Social History Social History   Tobacco Use  . Smoking status: Unknown If Ever Smoked  . Smokeless tobacco: Never Used  Substance Use Topics  . Alcohol use: Not Currently  . Drug use: Never    Review of Systems Unable to obtain due to dementia  ____________________________________________   PHYSICAL EXAM:  VITAL SIGNS: ED Triage Vitals  Enc Vitals Group     BP 09/18/18 1823 111/71     Pulse Rate 09/18/18 1823 81     Resp 09/18/18 1823 16     Temp 09/18/18 1823 97.7 F (36.5 C)     Temp Source 09/18/18 1823 Oral     SpO2 09/18/18 1823 96 %     Weight 09/18/18 1821 169 lb 15.6 oz (77.1 kg)     Height 09/18/18 1821 5\' 5"  (1.651 m)     Head Circumference --      Peak Flow --  Pain Score --      Pain Loc --      Pain Edu? --      Excl. in GC? --     Constitutional: Alert and oriented to person Eyes: Conjunctivae are normal.  Head: Atraumatic. Nose: No congestion/rhinnorhea. Mouth/Throat: Mucous membranes are moist.  Oropharynx non-erythematous. Neck: No stridor.  Cardiovascular: Normal rate, regular rhythm. Grossly normal heart sounds.  Good peripheral circulation. Respiratory: Normal respiratory effort.  No retractions. Lungs CTAB. Gastrointestinal: Soft and nontender. No distention. No abdominal bruits. No CVA tenderness. Musculoskeletal: No lower extremity tenderness nor edema.   Neurologic:  Normal speech and language. No gross focal neurologic deficits are appreciated.  Skin:  Skin is warm, dry and intact. No rash  noted.   ____________________________________________   LABS (all labs ordered are listed, but only abnormal results are displayed)  Labs Reviewed - No data to display ____________________________________________  EKG   ____________________________________________  RADIOLOGY  ED MD interpretation:    Official radiology report(s): No results found.  ____________________________________________   PROCEDURES  Procedure(s) performed (including Critical Care):  Procedures   ____________________________________________   INITIAL IMPRESSION / ASSESSMENT AND PLAN / ED COURSE  Sedated.  I have consulted tele-psychiatry.  We will have to wait to do anything with them until she wakes up however.  Patient's mom will stay as long as possible to help out.              ____________________________________________   FINAL CLINICAL IMPRESSION(S) / ED DIAGNOSES  Final diagnoses:  Aggressive behavior     ED Discharge Orders    None       Note:  This document was prepared using Dragon voice recognition software and may include unintentional dictation errors.    Arnaldo Natal, MD 09/18/18 2253

## 2018-09-18 NOTE — ED Notes (Signed)
Pt assisted back to bed with staff. Mother visiting pt in hallway. Pt intermittently yelling and smiling.

## 2018-09-18 NOTE — ED Notes (Signed)
Patient sent from [facility] for aggressive behavior. No aggressive behavior shown to staff at this time. Patient disoriented X4. Patient is calm and cooperative

## 2018-09-18 NOTE — ED Notes (Addendum)
Pt started to yell and wonder around [address]. Pt screaming, pt aggressive towards staff.  Dr. Darnelle Catalan gave verbal order for 2mg  of Ativan IM

## 2018-09-18 NOTE — ED Notes (Signed)
TTS at bedside talking to pt's mother

## 2018-09-19 DIAGNOSIS — R4689 Other symptoms and signs involving appearance and behavior: Secondary | ICD-10-CM | POA: Diagnosis not present

## 2018-09-19 DIAGNOSIS — G300 Alzheimer's disease with early onset: Secondary | ICD-10-CM

## 2018-09-19 DIAGNOSIS — F0281 Dementia in other diseases classified elsewhere with behavioral disturbance: Secondary | ICD-10-CM | POA: Diagnosis not present

## 2018-09-19 DIAGNOSIS — F0280 Dementia in other diseases classified elsewhere without behavioral disturbance: Secondary | ICD-10-CM | POA: Diagnosis present

## 2018-09-19 DIAGNOSIS — G309 Alzheimer's disease, unspecified: Secondary | ICD-10-CM

## 2018-09-19 LAB — LIPID PANEL
CHOLESTEROL: 175 mg/dL (ref 0–200)
HDL: 42 mg/dL (ref >40)
LDL Cholesterol: 118 mg/dL — ABNORMAL HIGH (ref 0–99)
Total CHOL/HDL Ratio: 4.2 RATIO
Triglycerides: 77 mg/dL (ref <150)
VLDL: 15 mg/dL (ref 0–40)

## 2018-09-19 LAB — COMPREHENSIVE METABOLIC PANEL
ALBUMIN: 3.9 g/dL (ref 3.5–5.0)
ALT: 24 U/L (ref 0–44)
ANION GAP: 7 (ref 5–15)
AST: 39 U/L (ref 15–41)
Alkaline Phosphatase: 36 U/L — ABNORMAL LOW (ref 38–126)
BUN: 13 mg/dL (ref 6–20)
CO2: 27 mmol/L (ref 22–32)
Calcium: 9 mg/dL (ref 8.9–10.3)
Chloride: 107 mmol/L (ref 98–111)
Creatinine, Ser: 0.79 mg/dL (ref 0.44–1.00)
GFR calc Af Amer: >60 mL/min (ref >60)
GFR calc non Af Amer: >60 mL/min (ref >60)
Glucose, Bld: 91 mg/dL (ref 70–99)
POTASSIUM: 4.2 mmol/L (ref 3.5–5.1)
Sodium: 141 mmol/L (ref 135–145)
Total Bilirubin: 0.9 mg/dL (ref 0.3–1.2)
Total Protein: 6.8 g/dL (ref 6.5–8.1)

## 2018-09-19 LAB — URINALYSIS, ROUTINE W REFLEX MICROSCOPIC
Bilirubin Urine: NEGATIVE
Glucose, UA: NEGATIVE mg/dL
Ketones, ur: NEGATIVE mg/dL
LEUKOCYTE UA: NEGATIVE
Nitrite: NEGATIVE
Protein, ur: NEGATIVE mg/dL
Specific Gravity, Urine: 1.003 — ABNORMAL LOW (ref 1.005–1.030)
pH: 7 (ref 5.0–8.0)

## 2018-09-19 LAB — CBC
HCT: 41.4 % (ref 36.0–46.0)
Hemoglobin: 13.7 g/dL (ref 12.0–15.0)
MCH: 29.9 pg (ref 26.0–34.0)
MCHC: 33.1 g/dL (ref 30.0–36.0)
MCV: 90.4 fL (ref 80.0–100.0)
PLATELETS: 185 10*3/uL (ref 150–400)
RBC: 4.58 MIL/uL (ref 3.87–5.11)
RDW: 11.9 % (ref 11.5–15.5)
WBC: 5.1 10*3/uL (ref 4.0–10.5)
nRBC: 0 % (ref 0.0–0.2)

## 2018-09-19 LAB — TSH: TSH: 0.801 u[IU]/mL (ref 0.350–4.500)

## 2018-09-19 LAB — FOLATE: Folate: 16.7 ng/mL (ref >5.9)

## 2018-09-19 LAB — SEDIMENTATION RATE: Sed Rate: 16 mm/hr (ref 0–30)

## 2018-09-19 MED ORDER — ESCITALOPRAM OXALATE 10 MG PO TABS
10.0000 mg | ORAL_TABLET | Freq: Every day | ORAL | Status: DC
  Administered 2018-09-19 – 2018-09-21 (×2): 10 mg via ORAL
  Filled 2018-09-19 (×4): qty 1

## 2018-09-19 MED ORDER — MEMANTINE HCL 5 MG PO TABS
10.0000 mg | ORAL_TABLET | Freq: Two times a day (BID) | ORAL | Status: DC
  Administered 2018-09-19 – 2018-09-21 (×4): 10 mg via ORAL
  Filled 2018-09-19 (×7): qty 2

## 2018-09-19 MED ORDER — QUETIAPINE FUMARATE 25 MG PO TABS: 25.0000 mg | ORAL_TABLET | Freq: Two times a day (BID) | ORAL | Status: DC

## 2018-09-19 MED ORDER — OLANZAPINE 10 MG PO TABS
10.0000 mg | ORAL_TABLET | ORAL | Status: AC
  Administered 2018-09-19: 10 mg via ORAL
  Filled 2018-09-19: qty 1

## 2018-09-19 MED ORDER — QUETIAPINE FUMARATE 25 MG PO TABS
50.0000 mg | ORAL_TABLET | Freq: Every day | ORAL | Status: DC
  Administered 2018-09-19: 50 mg via ORAL
  Filled 2018-09-19: qty 2

## 2018-09-19 MED ORDER — LORAZEPAM 2 MG/ML IJ SOLN
1.0000 mg | INTRAMUSCULAR | Status: DC | PRN
  Administered 2018-09-20: 1 mg via INTRAMUSCULAR
  Filled 2018-09-19 (×2): qty 1

## 2018-09-19 MED ORDER — PRAVASTATIN SODIUM 10 MG PO TABS
10.0000 mg | ORAL_TABLET | Freq: Every day | ORAL | Status: DC
  Administered 2018-09-21: 10 mg via ORAL
  Filled 2018-09-19 (×4): qty 1

## 2018-09-19 MED ORDER — HALOPERIDOL LACTATE 5 MG/ML IJ SOLN
2.0000 mg | INTRAMUSCULAR | Status: DC | PRN
  Administered 2018-09-20: 2 mg via INTRAMUSCULAR
  Filled 2018-09-19 (×2): qty 1

## 2018-09-19 MED ORDER — DIVALPROEX SODIUM 125 MG PO CSDR
125.0000 mg | DELAYED_RELEASE_CAPSULE | Freq: Two times a day (BID) | ORAL | Status: DC
  Administered 2018-09-19 – 2018-09-21 (×4): 125 mg via ORAL
  Filled 2018-09-19 (×9): qty 1

## 2018-09-19 MED ORDER — LEVETIRACETAM 500 MG PO TABS
500.0000 mg | ORAL_TABLET | Freq: Two times a day (BID) | ORAL | Status: DC
  Administered 2018-09-19 – 2018-09-21 (×4): 500 mg via ORAL
  Filled 2018-09-19 (×7): qty 1

## 2018-09-19 MED ORDER — QUETIAPINE FUMARATE 25 MG PO TABS
25.0000 mg | ORAL_TABLET | Freq: Two times a day (BID) | ORAL | Status: DC
  Administered 2018-09-20: 25 mg via ORAL
  Filled 2018-09-19 (×2): qty 1

## 2018-09-19 NOTE — Consult Note (Signed)
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 female patient admitted with agitated behavior from her facility.  See mother's report below for her behaviors with verbal and some physical aggression.  HPI: The patient is able to provide her first name only.  She sees seems to say "yep" to all other questions.  She was quite sedated for close to 12 hours after injection of Haldol and Ativan now that is arousing his drink some juice he ate a piece of bread but is getting more agitated.  Call to mother/guardian: In [facility] facility since January, took care of her at home until then but got too much. Comes on when in a crowd liike a meal being set up or a beeping sound like on another patient's wheelchair or when has toileting needs can gets upset when try to change her clothes starts trying to bite scratch hit, curse. If sees mother also starts up again, so mother had to stay out of sight when came to ED. Seems to make her want to go home. She has 3 sons. Onset 2015 lost her job we didn't know why. Before that went on 3 mission trips, had job government subsidized housing, hired workers, promoted to supervisory position. 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 Got lost several times driving her usual route once with grandkids, got her to describe what she could see and they went and found her, had to take car keys away. When riding started saying go when light red. No income lost house, on disability now,  moved in with mother 2 years ago. Moved her to [facility] January, goes daily to see her except 3 days since then. [place] [facility] they will only take her back if has 24 hour sitter, and needs medication changes.  She has been followed by neurologist Lexapro Namenda and low-dose Seroquel.  She also has a history of a possible seizure on Keppra she is also on very low-dose Depakote so that low-dose is probably being used in the hope it might reduce some behaviors.  Past Psychiatric History: Negative until developed dementia  Risk to Self:  No Risk to Others:  Potentially due to responding to stimuli such as noises or crowds or agitation. Prior Inpatient Therapy:  No Prior Outpatient Therapy:  Yes with neurologist  Past Medical History:  Past Medical History:  Diagnosis Date  . Alzheimer's dementia (HCC)   . Hyperlipidemia     Past Surgical History:  Procedure Laterality Date  . ABDOMINAL HYSTERECTOMY     Family History:  Family History  Problem Relation Age of Onset  . Breast cancer Maternal Grandmother 85       mat great gm  . Breast cancer Cousin 47   Family Psychiatric  History:  Social History:  Social History   Substance and Sexual Activity  Alcohol Use Not Currently     Social History   Substance and Sexual Activity  Drug Use Never    Social History   Socioeconomic History  . Marital status: Widowed    Spouse name:  Not on file  . Number of children: Not on file  . Years of education: Not on file  . Highest education level: Not on file  Occupational History  . Not on file  Social Needs  . Financial resource strain: Not on file  . Food insecurity:    Worry: Not on file    Inability: Not on file  . Transportation needs:    Medical: Not on file    Non-medical: Not on file  Tobacco Use  . Smoking status: Unknown If Ever Smoked  . Smokeless tobacco: Never Used  Substance and Sexual Activity  . Alcohol use: Not Currently  . Drug use: Never  . Sexual activity: Not on  file  Lifestyle  . Physical activity:    Days per week: Not on file    Minutes per session: Not on file  . Stress: Not on file  Relationships  . Social connections:    Talks on phone: Not on file    Gets together: Not on file    Attends religious service: Not on file    Active member of club or organization: Not on file    Attends meetings of clubs or organizations: Not on file    Relationship status: Not on file  Other Topics Concern  . Not on file  Social History Narrative  . Not on file   Additional Social History:    Allergies:  No Known Allergies  Labs:  Results for orders placed or performed during the hospital encounter of 09/17/18 (from the past 48 hour(s))  CBC     Status: None   Collection Time: 09/17/18  8:55 PM  Result Value Ref Range   WBC 5.8 4.0 - 10.5 K/uL   RBC 4.64 3.87 - 5.11 MIL/uL   Hemoglobin 13.5 12.0 - 15.0 g/dL   HCT 16.141.9 09.636.0 - 04.546.0 %   MCV 90.3 80.0 - 100.0 fL   MCH 29.1 26.0 - 34.0 pg   MCHC 32.2 30.0 - 36.0 g/dL   RDW 40.912.1 81.111.5 - 91.415.5 %   Platelets 207 150 - 400 K/uL   nRBC 0.0 0.0 - 0.2 %    Comment: Performed at [facility], [address] [place], [place] [address]  Comprehensive metabolic panel     Status: Abnormal   Collection Time: 09/17/18  8:55 PM  Result Value Ref Range   Sodium 143 135 - 145 mmol/L   Potassium 4.1 3.5 - 5.1 mmol/L   Chloride 110 98 - 111 mmol/L   CO2 27 22 - 32 mmol/L   Glucose, Bld 104 (H) 70 - 99 mg/dL   BUN 16 6 - 20 mg/dL   Creatinine, Ser 6.210.94 0.44 - 1.00 mg/dL   Calcium 9.2 8.9 - 30.810.3 mg/dL   Total Protein 7.1 6.5 - 8.1 g/dL   Albumin 4.1 3.5 - 5.0 g/dL   AST 26 15 - 41 U/L   ALT 19 0 - 44 U/L   Alkaline Phosphatase 38 38 - 126 U/L   Total Bilirubin 0.6 0.3 - 1.2 mg/dL   GFR calc non Af Amer >60 >60 mL/min   GFR calc Af Amer >60 >60 mL/min   Anion gap 6 5 - 15    Comment: Performed at [facility], [address] [place], [place] [address]  Urinalysis, Complete w  Microscopic     Status: Abnormal   Collection Time: 09/17/18  8:55 PM  Result Value Ref Range   Color, Urine YELLOW (A)  YELLOW   APPearance CLEAR (A) CLEAR   Specific Gravity, Urine 1.020 1.005 - 1.030   pH 6.0 5.0 - 8.0   Glucose, UA NEGATIVE NEGATIVE mg/dL   Hgb urine dipstick NEGATIVE NEGATIVE   Bilirubin Urine NEGATIVE NEGATIVE   Ketones, ur NEGATIVE NEGATIVE mg/dL   Protein, ur NEGATIVE NEGATIVE mg/dL   Nitrite NEGATIVE NEGATIVE   Leukocytes,Ua NEGATIVE NEGATIVE   RBC / HPF 0-5 0 - 5 RBC/hpf   WBC, UA 0-5 0 - 5 WBC/hpf   Bacteria, UA NONE SEEN NONE SEEN   Squamous Epithelial / LPF 0-5 0 - 5   Mucus PRESENT     Comment: Performed at [facility], [address] [place], [place] [address]    Current Facility-Administered Medications  Medication Dose Route Frequency Provider Last Rate Last Dose  . divalproex (DEPAKOTE SPRINKLE) capsule 125 mg  125 mg Oral BID Minna Antis, MD   125 mg at 09/19/18 1310  . escitalopram (LEXAPRO) tablet 10 mg  10 mg Oral Daily Minna Antis, MD   10 mg at 09/19/18 1310  . levETIRAcetam (KEPPRA) tablet 500 mg  500 mg Oral BID Minna Antis, MD   500 mg at 09/19/18 1310  . memantine (NAMENDA) tablet 10 mg  10 mg Oral BID Minna Antis, MD   10 mg at 09/19/18 1309  . pravastatin (PRAVACHOL) tablet 10 mg  10 mg Oral q1800 Minna Antis, MD      . QUEtiapine (SEROQUEL) tablet 25 mg  25 mg Oral BID Minna Antis, MD       Current Outpatient Medications  Medication Sig Dispense Refill  . divalproex (DEPAKOTE SPRINKLE) 125 MG capsule Take 125 mg by mouth 2 (two) times daily.    Marland Kitchen escitalopram (LEXAPRO) 10 MG tablet Take 10 mg by mouth daily.     Marland Kitchen levETIRAcetam (KEPPRA) 500 MG tablet Take 1 tablet (500 mg total) by mouth 2 (two) times daily. 60 tablet 0  . lovastatin (MEVACOR) 20 MG tablet Take 20 mg by mouth daily at 6 PM.     . memantine (NAMENDA) 10 MG tablet Take 10 mg by mouth 2 (two) times daily.     .  QUEtiapine (SEROQUEL) 25 MG tablet Take 25 mg by mouth 2 (two) times daily.       Musculoskeletal: Strength & Muscle Tone: within normal limits Gait & Station: normal Patient leans: N/A  Psychiatric Specialty Exam: Physical Exam  ROS  Blood pressure 113/88, pulse (!) 57, temperature 97.8 F (36.6 C), temperature source Oral, resp. rate 17, height 5\' 5"  (1.651 m), weight 77.1 kg, SpO2 100 %.Body mass index is 28.29 kg/m.  General Appearance: Casual  Eye Contact:  Poor  Speech:  She has a few words most of which seem random and nonsensical.  Volume:  Normal  Mood:  Difficulty went for repeat episodes of agitation  Affect:  Flat  Thought Process:  Disorganized  Orientation:  Other:  First name only  Thought Content:  Illogical  Suicidal Thoughts:  No apparent SI  Homicidal Thoughts:  No  Memory:  Immediate;   Poor Recent;   Poor Remote;   Poor  Judgement:  Poor  Insight:  Lacking  Psychomotor Activity:  Normal  Concentration:  Concentration: Poor and Attention Span: Poor  Recall:  Poor  Fund of Knowledge:  Poor  Language:  Poor  Akathisia:  No  Handed:  Right  AIMS (if indicated):     Assets:  Social Support  ADL's:  Impaired  Cognition:  Impaired,  Severe  Sleep:        Treatment Plan Summary: Daily contact with patient to assess and evaluate symptoms and progress in treatment, Medication management and Plan Geropsychiatry admission decrease PRN medications to Haldol 2 mg plus Ativan 1 mg IM as needed.  Titrate Seroquel 25 mg every morning and every 2 p.m. 50 mg nightly continue with her home medications although I doubt the Depakote is contributing much as it subtherapeutic for treatment of seizures and this likely be given for behavioral disturbances, the number needed to treat to find benefit with Depakote low-dose and dementia is high so I will discontinue this medication since it is already been tried without benefit.  Trial of Zyprexa 10 mg p.o. now x1.  CBC CMP TSH  urinalysis B12 RPR lipid panel sedimentation rate folate.  Disposition: Recommend psychiatric Inpatient admission when medically cleared.  Terance Hart, MD 09/19/2018 3:17 PM

## 2018-09-19 NOTE — ED Notes (Signed)
Report received 

## 2018-09-19 NOTE — ED Notes (Signed)
Diaper and sheets changed. Pt getting agitated.  Pulled socks off.  Trying to get up. Yelling at staff.  Wants to leave. Pt screaming. Very confused. Called TTS and requested psych consult see pt now if possible so that they can see her before possibly needing to medicate patient and she is too drowsy to assess.

## 2018-09-19 NOTE — ED Notes (Signed)
Pt eating some of lunch.

## 2018-09-19 NOTE — ED Notes (Signed)
Provided pt with a warm blanket, pt continues to sleep, will continue to monitor

## 2018-09-19 NOTE — ED Notes (Signed)
Will obtain urine specimen after pt given medication to calm.

## 2018-09-19 NOTE — ED Notes (Addendum)
Pt drinking juice. Has lunch tray. encouraging pt to eat. Lights on in room

## 2018-09-19 NOTE — ED Notes (Signed)
Pt given food tray but pt fell back asleep after multiple attempts to wake  Her for dinner.

## 2018-09-19 NOTE — ED Notes (Signed)
Meal tray ordered for pt. Depends was changed when pt dressed out. Pt cleaned.

## 2018-09-19 NOTE — ED Notes (Signed)
Pt trying to walk out. Escorted back to room

## 2018-09-19 NOTE — ED Notes (Signed)
Mother legal guardian updated by  Bing, first nurse.  Mother will return at visiting hours.

## 2018-09-19 NOTE — ED Notes (Signed)
Waiting on [facility]

## 2018-09-19 NOTE — ED Notes (Signed)
Waiting on [facility] consult

## 2018-09-19 NOTE — ED Notes (Signed)
Pt eating dinner tray °

## 2018-09-19 NOTE — ED Notes (Signed)
Pt woke. Only able to tell RN name at this time.  Unlabored. Even respirations, skin warm dry and pink. NAD. In low bed.

## 2018-09-19 NOTE — BH Assessment (Signed)
Assessment Note  Christine Oconnell is an 58 y.o. female who presents to the ED via ems from [facility] for aggressive behavior that the facility reported has increased in severity over the last few days. Pt accompanied by her mother who reports that pt lived with her until the middle of January. Per EMS report from facility pt has been throwing chairs and silverware at residents and staff. Pt is currently not oriented x4 and is not appropriate to complete assessment. Pt was dx with early onset of Alzheimer's inn 2015. Pt is mother to 3 sons and has 3 grandsons. Her primary care giver and contact person is her mother Christine Oconnell [phone]). Pt became agitated in the ED and was given some IM medications.   Diagnosis: Alzheimer's Dementia  Past Medical History:  Past Medical History:  Diagnosis Date  . Alzheimer's dementia (HCC)   . Hyperlipidemia     Past Surgical History:  Procedure Laterality Date  . ABDOMINAL HYSTERECTOMY      Family History:  Family History  Problem Relation Age of Onset  . Breast cancer Maternal Grandmother 85       mat great gm  . Breast cancer Cousin 70    Social History:  has an unknown smoking status. She has never used smokeless tobacco. She reports previous alcohol use. She reports that she does not use drugs.  Additional Social History:     CIWA: CIWA-Ar BP: 111/71 Pulse Rate: 81 COWS:    Allergies: No Known Allergies  Home Medications: (Not in a hospital admission)   OB/GYN Status:  No LMP recorded. Patient has had a hysterectomy.  General Assessment Data Assessment unable to be completed: Yes Reason for not completing assessment: Altered Mental Status                                                 Advance Directives (For Healthcare) Does Patient Have a Medical Advance Directive?: No          Disposition:     On Site Evaluation by:   Reviewed with Physician:    Venicia Vandall D  Malissia Rabbani 09/19/2018 12:55 AM

## 2018-09-19 NOTE — ED Notes (Signed)
Pt observed sleeping at this time , =chest rise and fall , pt in view of staff , nad at this time

## 2018-09-19 NOTE — ED Notes (Signed)
Pt dressed out by this RN and scott NT.    1 pair jeans 1 pair socks 1 pair tennis shoes 1 shirt 1 gray colored bracelet  Glasses remain with pt

## 2018-09-19 NOTE — ED Notes (Signed)
Pt has been woken to eat by tech multiple times but pt just returns to sleep.

## 2018-09-19 NOTE — ED Notes (Addendum)
Psych consult at bedside. Pt given crackers.

## 2018-09-19 NOTE — ED Notes (Signed)
Fed patient food she was eating when I arrived. She took a few bites of sandwich and refused the rest. Patient become slightly aggressive when I tied to fix her bed.AS

## 2018-09-20 DIAGNOSIS — R4689 Other symptoms and signs involving appearance and behavior: Secondary | ICD-10-CM | POA: Diagnosis not present

## 2018-09-20 DIAGNOSIS — F0281 Dementia in other diseases classified elsewhere with behavioral disturbance: Secondary | ICD-10-CM | POA: Diagnosis not present

## 2018-09-20 DIAGNOSIS — G300 Alzheimer's disease with early onset: Secondary | ICD-10-CM | POA: Diagnosis not present

## 2018-09-20 LAB — VITAMIN B12: Vitamin B-12: 917 pg/mL — ABNORMAL HIGH (ref 180–914)

## 2018-09-20 MED ORDER — OLANZAPINE 10 MG IM SOLR
2.5000 mg | Freq: Two times a day (BID) | INTRAMUSCULAR | Status: DC
  Administered 2018-09-20: 2.5 mg via INTRAMUSCULAR
  Filled 2018-09-20 (×4): qty 10

## 2018-09-20 MED ORDER — OLANZAPINE 5 MG PO TABS
2.5000 mg | ORAL_TABLET | Freq: Two times a day (BID) | ORAL | Status: DC
  Administered 2018-09-21 (×2): 2.5 mg via ORAL
  Filled 2018-09-20 (×4): qty 1

## 2018-09-20 NOTE — ED Notes (Signed)
Pt's mother called to check on pt, updated onto status.

## 2018-09-20 NOTE — BH Assessment (Addendum)
Referral information for Psychiatric Hospitalization faxed to;   [facility] [phone])   . [facility] [phone])  . [facility] [phone])  . [facility] [phone])  . [facility] [phone])  . [facility] [phone])  . [place] [phone])  . [place] [phone])  . [facility] [phone])  . [facility] [phone])   . [facility] [phone])  . [facility] [phone])  . [address] [phone])  . [facility] [phone]), Pending Review  . [facility] Rushie Goltz [phone])  . [place] [phone])

## 2018-09-20 NOTE — ED Notes (Signed)
VOL/Consult completed/Pending Placement 

## 2018-09-20 NOTE — ED Notes (Signed)
Pt sleeping. 

## 2018-09-20 NOTE — ED Notes (Signed)
Report from ann, rn.  

## 2018-09-20 NOTE — Consult Note (Signed)
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 female patient admitted with agitated behavior from her facility.  See mother's report below for her behaviors with verbal and some physical aggression.  09/20/18: Lab CMP CBC B12 Folate TSH WNL, UA clear ECG NSR QTC 425ms. Calm in bed, able to state her first name only, says yea or yep to all questions. Refusing medications, nurses trying to get her to take medication in applesauce with limited success. She is uncooperative with staff when they attempt to help with ADLs, change clothes. Seeking geropsychiatry bed. DC Seroquel since refusing, trial of Zyprexa 2.5mg  po or give IM if refuses po, bid.  09/19/18 Call to mother/guardian: In [facility] facility since January, took care of her at home until then but got too much. Comes on when in a crowd liike a meal being set up or a beeping sound like on another patient's wheelchair or when has toileting needs can gets upset when try to change her clothes starts trying to bite scratch hit, curse. If sees mother also starts up again, so mother had to stay out of sight when came to ED. Seems to make her want to go home. She has 3 sons. Onset 2015 lost her job we didn't know why. Before that went on 3 mission trips, had job government subsidized housing, hired workers, promoted to supervisory position. 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 Got lost several times driving her usual route once with grandkids, got her to  describe what she could see and they went and found her, had to take car keys away. When riding started saying go when light red. No income lost house, on disability now, moved in with mother 2 years ago. [facility] [place], goes daily to see her except 3 days since then. [place] [facility]aid they will only take her back if has 24 hour sitter, and needs medication changes.  She has been followed by neurologist Lexapro Namenda and low-dose Seroquel.  She also has a history of a possible seizure on Keppra she is also on very low-dose Depakote so that low-dose is probably being used in the hope it might reduce some behaviors.  Past Psychiatric History: Negative until developed dementia  Risk to Self:  No Risk to Others:  Potentially due to responding to stimuli such as noises or crowds or agitation. Prior Inpatient Therapy:  No Prior Outpatient Therapy:  Yes with neurologist  Past Medical History:  Past Medical History:  Diagnosis Date  . Alzheimer's dementia (HCC)   . Hyperlipidemia     Past Surgical History:  Procedure Laterality Date  . ABDOMINAL HYSTERECTOMY     Family History:  Family History  Problem Relation Age of Onset  . Breast cancer Maternal Grandmother 85       mat great gm  . Breast cancer Cousin 350   Family Psychiatric  History:  Social History:  Social History   Substance and Sexual Activity  Alcohol Use Not Currently     Social History   Substance and Sexual Activity  Drug Use Never    Social History   Socioeconomic History  . Marital status: Widowed    Spouse name: Not on file  . Number of children: Not on file  . Years of education: Not on file  . Highest education level: Not on file  Occupational History  . Not on file  Social Needs  . Financial resource strain: Not on file  . Food insecurity:    Worry: Not on file    Inability: Not on file  . Transportation needs:    Medical: Not on file    Non-medical: Not on file  Tobacco Use  . Smoking  status: Unknown If Ever Smoked  . Smokeless tobacco: Never Used  Substance and Sexual Activity  . Alcohol use: Not Currently  . Drug use: Never  . Sexual activity: Not on file  Lifestyle  . Physical activity:    Days per week: Not on file    Minutes per session: Not on file  . Stress: Not on file  Relationships  . Social connections:    Talks on phone: Not on file    Gets together: Not on file    Attends religious service: Not on file    Active member of club or organization: Not on file    Attends meetings of clubs or organizations: Not on file    Relationship status: Not on file  Other Topics Concern  . Not on file  Social History Narrative  . Not on file   Additional Social History:    Allergies:  No Known Allergies  Labs:  Results for orders placed or performed during the hospital encounter of 09/18/18 (from the past 48 hour(s))  Vitamin B12     Status: Abnormal   Collection Time: 09/19/18  3:24 PM  Result Value Ref Range   Vitamin B-12 917 (H) 180 - 914 pg/mL    Comment: (NOTE) This assay is not validated for testing neonatal or myeloproliferative syndrome specimens for Vitamin B12 levels. Performed at [facility], [address] [place], [place] [address]   Comprehensive metabolic panel     Status: Abnormal   Collection Time: 09/19/18  3:25 PM  Result Value Ref Range   Sodium 141 135 - 145 mmol/L   Potassium 4.2 3.5 - 5.1 mmol/L    Comment: HEMOLYSIS AT THIS LEVEL MAY AFFECT RESULT   Chloride 107 98 - 111 mmol/L   CO2 27 22 - 32 mmol/L   Glucose, Bld 91 70 - 99 mg/dL   BUN 13 6 - 20 mg/dL   Creatinine, Ser 9.14 0.44 - 1.00 mg/dL   Calcium 9.0 8.9 - 78.2 mg/dL   Total Protein 6.8 6.5 - 8.1 g/dL   Albumin 3.9 3.5 - 5.0 g/dL   AST 39 15 - 41 U/L   ALT 24 0 - 44 U/L   Alkaline Phosphatase 36 (L) 38 - 126 U/L   Total Bilirubin 0.9 0.3 - 1.2 mg/dL   GFR calc non Af Amer >60 >60 mL/min   GFR calc Af Amer >60 >60 mL/min   Anion gap 7 5 - 15     Comment: Performed at [facility], [address] [place], [place] [address]  Sedimentation rate     Status: None   Collection Time: 09/19/18  3:25 PM  Result Value Ref Range   Sed Rate 16 0 - 30 mm/hr    Comment: Performed at [facility], [address], [place], [place] [address]  CBC     Status: None   Collection Time: 09/19/18  3:25 PM  Result Value Ref Range   WBC 5.1 4.0 - 10.5 K/uL   RBC 4.58 3.87 - 5.11 MIL/uL   Hemoglobin 13.7 12.0 - 15.0 g/dL   HCT 16.1 09.6 - 04.5 %   MCV 90.4 80.0 - 100.0 fL   MCH 29.9 26.0 - 34.0 pg   MCHC 33.1 30.0 - 36.0 g/dL   RDW 40.9 81.1 - 91.4 %   Platelets 185 150 - 400 K/uL   nRBC 0.0 0.0 - 0.2 %    Comment: Performed at [facility], [address] [place], [place] [address]  TSH     Status: None   Collection Time: 09/19/18  3:25 PM  Result Value Ref Range   TSH 0.801 0.350 - 4.500 uIU/mL    Comment: Performed by a 3rd Generation assay with a functional sensitivity of <=0.01 uIU/mL. Performed at [facility], [address] [place], [place] [address]   Folate     Status: None   Collection Time: 09/19/18  3:25 PM  Result Value Ref Range   Folate 16.7 >5.9 ng/mL    Comment: HEMOLYSIS AT THIS LEVEL MAY AFFECT RESULT Performed at [facility], [address] [place], [place] [address]   Lipid panel     Status: Abnormal   Collection Time: 09/19/18  3:25 PM  Result Value Ref Range   Cholesterol 175 0 - 200 mg/dL   Triglycerides 77 <469 mg/dL   HDL 42 >62 mg/dL   Total CHOL/HDL Ratio 4.2 RATIO   VLDL 15 0 - 40 mg/dL   LDL Cholesterol 952 (H) 0 - 99 mg/dL    Comment:        Total Cholesterol/HDL:CHD Risk Coronary Heart Disease Risk Table                     Men   Women  1/2 Average Risk   3.4   3.3  Average Risk       5.0   4.4  2 X Average Risk   9.6   7.1  3 X Average Risk  23.4   11.0        Use the calculated Patient Ratio above and the CHD Risk Table to determine the  patient's CHD Risk.        ATP III CLASSIFICATION (LDL):  <100     mg/dL   Optimal  841-324  mg/dL   Near or Above                    Optimal  130-159  mg/dL   Borderline  401-027  mg/dL   High  >253     mg/dL   Very High Performed at [facility], [address] [place], [place] [address]   Urinalysis, Routine w reflex microscopic     Status: Abnormal   Collection Time: 09/19/18  7:37 PM  Result Value Ref Range   Color, Urine COLORLESS (A) YELLOW   APPearance CLEAR (A) CLEAR   Specific Gravity, Urine 1.003 (L) 1.005 - 1.030   pH 7.0 5.0 - 8.0   Glucose, UA NEGATIVE NEGATIVE mg/dL   Hgb urine dipstick MODERATE (A) NEGATIVE   Bilirubin Urine NEGATIVE NEGATIVE   Ketones, ur NEGATIVE NEGATIVE mg/dL   Protein, ur NEGATIVE NEGATIVE mg/dL   Nitrite NEGATIVE NEGATIVE   Leukocytes,Ua NEGATIVE NEGATIVE   RBC / HPF 0-5 0 -  5 RBC/hpf   WBC, UA 0-5 0 - 5 WBC/hpf   Bacteria, UA RARE (A) NONE SEEN   Squamous Epithelial / LPF 0-5 0 - 5    Comment: Performed at [facility], [address] [place], [place] [address]    Current Facility-Administered Medications  Medication Dose Route Frequency Provider Last Rate Last Dose  . divalproex (DEPAKOTE SPRINKLE) capsule 125 mg  125 mg Oral BID Minna Antis, MD   125 mg at 09/19/18 2246  . escitalopram (LEXAPRO) tablet 10 mg  10 mg Oral Daily Minna Antis, MD   10 mg at 09/19/18 1310  . haloperidol lactate (HALDOL) injection 2 mg  2 mg Intramuscular Q4H PRN Terance Hart, MD   2 mg at 09/20/18 1445   And  . LORazepam (ATIVAN) injection 1 mg  1 mg Intramuscular Q4H PRN Terance Hart, MD   1 mg at 09/20/18 1445  . levETIRAcetam (KEPPRA) tablet 500 mg  500 mg Oral BID Minna Antis, MD   500 mg at 09/19/18 2245  . memantine (NAMENDA) tablet 10 mg  10 mg Oral BID Minna Antis, MD   10 mg at 09/19/18 2247  . pravastatin (PRAVACHOL) tablet 10 mg  10 mg Oral q1800 Minna Antis, MD      .  QUEtiapine (SEROQUEL) tablet 25 mg  25 mg Oral BID Terance Hart, MD   25 mg at 09/20/18 1307  . QUEtiapine (SEROQUEL) tablet 50 mg  50 mg Oral QHS Terance Hart, MD   50 mg at 09/19/18 2246   Current Outpatient Medications  Medication Sig Dispense Refill  . divalproex (DEPAKOTE SPRINKLE) 125 MG capsule Take 125 mg by mouth 2 (two) times daily.    Marland Kitchen escitalopram (LEXAPRO) 10 MG tablet Take 10 mg by mouth daily.     Marland Kitchen levETIRAcetam (KEPPRA) 500 MG tablet Take 1 tablet (500 mg total) by mouth 2 (two) times daily. 60 tablet 0  . lovastatin (MEVACOR) 20 MG tablet Take 20 mg by mouth daily at 6 PM.     . memantine (NAMENDA) 10 MG tablet Take 10 mg by mouth 2 (two) times daily.     . QUEtiapine (SEROQUEL) 25 MG tablet Take 25 mg by mouth 2 (two) times daily.       Musculoskeletal: Strength & Muscle Tone: within normal limits Gait & Station: normal Patient leans: N/A  Psychiatric Specialty Exam: Physical Exam  ROS  Blood pressure 107/73, pulse 60, temperature 97.8 F (36.6 C), temperature source Oral, resp. rate 18, height 5\' 5"  (1.651 m), weight 77.1 kg, SpO2 99 %.Body mass index is 28.29 kg/m.  General Appearance: Casual  Eye Contact:  Poor  Speech:  She has a few words most of which seem random and nonsensical.  Volume:  Normal  Mood:  Difficulty went for repeat episodes of agitation  Affect:  Flat  Thought Process:  Disorganized  Orientation:  Other:  First name only  Thought Content:  Illogical  Suicidal Thoughts:  No apparent SI  Homicidal Thoughts:  No  Memory:  Immediate;   Poor Recent;   Poor Remote;   Poor  Judgement:  Poor  Insight:  Lacking  Psychomotor Activity:  Normal  Concentration:  Concentration: Poor and Attention Span: Poor  Recall:  Poor  Fund of Knowledge:  Poor  Language:  Poor  Akathisia:  No  Handed:  Right  AIMS (if indicated):     Assets:  Social Support  ADL's:  Impaired  Cognition:  Impaired,  Severe  Sleep:        Treatment  Plan Summary: Daily contact with patient to assess and evaluate symptoms and progress in treatment, Medication management and Plan Geropsychiatry admission decrease PRN medications to Haldol 2 mg plus Ativan 1 mg IM as needed.  Titrate Seroquel 25 mg every morning and every 2 p.m. 50 mg nightly continue with her home medications although I doubt the Depakote is contributing much as it subtherapeutic for treatment of seizures and this likely be given for behavioral disturbances, the number needed to treat to find benefit with Depakote low-dose and dementia is high so I will discontinue this medication since it is already been tried without benefit.  CBC CMP TSH urinalysis B12 RPR lipid panel sedimentation rate folate.  09/20/18: DC Seroquel since refusing trial of Zyprexa 2.5mg  bid po or IM if refuses.  Disposition: Recommend psychiatric Inpatient admission when medically cleared.  Terance Hart, MD 09/20/2018 3:35 PM

## 2018-09-20 NOTE — ED Notes (Signed)
RN in room to wake pt and attempt to administer medications. Pt refused medications with and without apple sauce. RN attempted to give pt water, pt refused water. RN will continue to monitor pt.

## 2018-09-20 NOTE — ED Notes (Signed)
Pt sleeping at this time, respirations are equal and unlabored at this time. No distress noted.

## 2018-09-20 NOTE — ED Notes (Signed)
Pt cleaned and dried from incontinent urine. Pt agitated with care but calms when stopped.

## 2018-09-20 NOTE — ED Provider Notes (Signed)
-----------------------------------------   3:43 AM on 09/20/2018 -----------------------------------------   Blood pressure 99/60, pulse (!) 56, temperature 97.8 F (36.6 C), temperature source Oral, resp. rate 20, height 5\' 5"  (1.651 m), weight 77.1 kg, SpO2 95 %.  The patient is calm and cooperative at this time.  There have been no acute events since the last update.  Awaiting disposition plan from Behavioral Medicine team.    Sharyn Creamer, MD 09/20/18 [phone]

## 2018-09-20 NOTE — ED Notes (Signed)
Patient VOL/ Pending inpatient placement when medically cleared.

## 2018-09-20 NOTE — ED Notes (Signed)
Pt aggressive with staff when trying to clean and change her brief.

## 2018-09-21 NOTE — BH Assessment (Addendum)
Pt has been denied by [facility] due to pt's inability to complete her ADLs (per [facility] [phone]).  This information was relayed to pt's mother (legal guardian).  TTS will continue to seek geriatric inpatient psych placement.

## 2018-09-21 NOTE — ED Notes (Signed)
Attempted to ambulate pt per request of accepting facility. Pt took 2 assist to get out of bed, cried while walking, and was unsteady on her feet. Will let MD know

## 2018-09-21 NOTE — ED Notes (Signed)
Pt tolerated a few bites of apple sauce, currently eating some french fries. Pt drank approx 2oz of apple juice

## 2018-09-21 NOTE — ED Provider Notes (Signed)
-----------------------------------------   6:59 PM on 09/21/2018 -----------------------------------------  Asked by nursing staff to check on the patient because she did not seem to be able to bear weight.  I could not get the patient to understand that I wanted her to get up out of bed and stand up.  She kept on being distracted by one thing or another.  I did examine her she does not appear to have any belly pain hip pain or trouble moving her legs.  Patient has no pain on palpation or compression or axial loading of any of her joints.  We can get her to sit up partially but then she got confused and lay back down again.  She seems to be comfortable and stable.   Arnaldo Natal, MD 09/21/18 [phone]

## 2018-09-21 NOTE — ED Notes (Signed)
Pt sleeping. 

## 2018-09-21 NOTE — ED Notes (Signed)
Patient drank 4oz of orange juice and has had approx. 8 oz of water.  Patient took pills well with applesauce.

## 2018-09-21 NOTE — ED Notes (Signed)
Patient ate 75% of her sausage biscuit, a few sips of water and a few sips of orange juice at this time.  Will continue to encourage fluids.

## 2018-09-21 NOTE — BH Assessment (Signed)
This Clinical research associate spoke with pt's mother [name]: [phone]) who confirmed she is pt's legal guardian. Pt's legal guardian approves of pt being accepted for [facility].  Pt's mother was asked to present to ED to sign acceptance paperwork (since pt is voluntary) that is being faxed to [phone] by [facility] (per [facility] [phone]). Pt's mother agreed to come to [facility] ED to complete paperwork.

## 2018-09-21 NOTE — ED Notes (Signed)
Pt took a few sips of water

## 2018-09-21 NOTE — ED Notes (Signed)
Pt resting. resps unlabored.  

## 2018-09-21 NOTE — ED Notes (Signed)
PT in room yelling "get away from me". Pt covered with blanket and lights dimmed. Pt no longer yelling at this time.

## 2018-09-21 NOTE — ED Notes (Signed)
Pt changed and cleaned up. PT agitated at changing, attempted to console

## 2018-09-21 NOTE — ED Notes (Signed)
Pt cleansed of incontinent urine. Pt combative during procedure, clean pants and sheets placed. Pt calms after procedure complete. Warm blankets provided, lights dimmed. Pt refuses po fluids when straw held to lips.

## 2018-09-21 NOTE — ED Notes (Signed)
Pt seen crying in bed. PT states she doesn't know what she wants to do. Pt covered with blankets, animal planet turned on for pt to watch.

## 2018-09-21 NOTE — ED Notes (Addendum)
PT up with 2 person assist. PT more easily out of bed than previous attempt to ambulate pt. PT able to take unsteady steps around room with with 2 people. Still would not be safe for pt to ambulate on own. No crying during ambulation, occasional quiet negative vocalization.

## 2018-09-21 NOTE — ED Provider Notes (Addendum)
-----------------------------------------   5:45 AM on 09/21/2018 -----------------------------------------   Blood pressure 126/90, pulse 66, temperature (!) 97 F (36.1 C), temperature source Axillary, resp. rate 14, height 1.651 m (5\' 5" ), weight 77.1 kg, SpO2 99 %.  The patient is calm and cooperative at this time.  There have been no acute events since the last update. Referrals have been sent to outside facilities and we are awaiting placement plans.    Loleta Rose, MD 09/21/18 [phone]

## 2018-09-21 NOTE — ED Notes (Signed)
Pt awake, moving feet and arms in bed, smiling at staff.

## 2018-09-21 NOTE — ED Notes (Signed)
Report to anna, rn

## 2018-09-21 NOTE — ED Notes (Signed)
Pt able to bed legs in bed with no distress. No obvious deformities noted

## 2018-09-21 NOTE — ED Provider Notes (Signed)
-----------------------------------------   5:18 PM on 09/21/2018 -----------------------------------------   Blood pressure (!) 118/92, pulse 69, temperature (!) 97 F (36.1 C), temperature source Axillary, resp. rate 15, height 5\' 5"  (1.651 m), weight 77.1 kg, SpO2 97 %.  The patient is calm and cooperative at this time.  [facility]    Arnaldo Natal, MD 09/21/18 [phone]

## 2018-09-21 NOTE — BH Assessment (Addendum)
Referral information for Psychiatric Hospitalization faxed to;   [facility] [employer]  . [place] ([phone]),  . [place] [phone] or [phone])  . [facility] [phone], [phone], [phone] or [phone]),   . [place] [phone] or [phone])  . [facility] [phone]),   . [facility] [phone]),   . [place] [phone])  . [facility] [phone]),   . [address] [facility] [phone]) - Denied due facility closing   . [facility] [phone] or [phone]) - under review per [facility]  . [place] [phone] or [phone]),   . [facility] [phone])

## 2018-09-21 NOTE — ED Notes (Signed)
Called [facility] for transport to [facility] in [place]  [phone]

## 2018-09-21 NOTE — ED Notes (Signed)
Pt ate half of sandwich Refused sips of water

## 2018-09-21 NOTE — ED Notes (Signed)
[name] (mother) [phone]

## 2018-09-21 NOTE — ED Notes (Signed)
MD made aware of pt immobility

## 2018-09-21 NOTE — ED Notes (Signed)
Pt awake, moving arms and legs in bed. Pt in no acute distress.

## 2018-09-21 NOTE — ED Notes (Addendum)
Adult protective brief dry at this time. resps unlabored. Room cleaned of trash. Pt sleeping.

## 2018-09-21 NOTE — BH Assessment (Addendum)
      Patient's Family/Support System Britta Mccreedy [place]: [phone]) have been updated as well.

## 2018-09-21 NOTE — ED Notes (Signed)
Pt calmly playing with blankets at this time. Respirations unlabored. Absence of negative vocalizations.

## 2018-09-21 NOTE — ED Notes (Signed)
Spoke to TTS-plan for pt is : pt awaiting bed in geri-psych facility

## 2018-09-22 NOTE — ED Notes (Signed)
BEHAVIORAL HEALTH ROUNDING Patient sleeping: Yes.   Patient alert and oriented: eyes closed  Appears asleep Behavior appropriate: Yes.  ; If no, describe:  Nutrition and fluids offered: Yes  Toileting and hygiene offered: sleeping Sitter present: q 15 minute observations and security monitoring Law enforcement present: yes  ODS 

## 2018-09-22 NOTE — ED Notes (Signed)

## 2018-09-22 NOTE — ED Notes (Signed)
Pt asleep, breakfast tray placed in rm.  

## 2018-09-22 NOTE — ED Notes (Signed)
Pt incontinent of urine. Pt brief changed and pt changed into her clothes.

## 2018-09-22 NOTE — ED Notes (Signed)
Hourly rounding reveals patient in room. No complaints, stable, in no acute distress. Q15 minute rounds and monitoring via Rover and Officer to continue.   

## 2018-09-22 NOTE — BH Assessment (Cosign Needed)
Christine Oconnell is a 58 y.o. female with a past history of dementia, presents to the emergency department for agitation at her nursing facility. The patient was seen this morning resting in bed quietly with eyes closed and no complaints.  Chart were reviewed, labs and medication assessed.  This provider discussed with patient nurse inquiring if the patient has awakened any during the morning.  It was reported that the patient was aggressive during the night and was given medication to assist her with resting. The patient was awaken by her nurse at which time she became very combative, screaming, banging on her bed and moving from side to side in her bed.  During periods where she was not being interactive with staff.  She continued to scream, banging on the bed, and moving around her bed vigorously.  Nursing staff was unable to redirect the patient.  Collateral was obtained by mother who expressed concerns about patient behavior at her assisted living place that she lives.  Mom did state that the staff at the facility would bring the patient to the ED for any little incident that took place.  Mom states that she have decided to care for the patient at home.  Mom inquired about outpatient services that could assist her in providing care for the patient. Mom was scheduling with TTS and care management to assist her in getting services to help her provide care for the patient once she is discharged.  In communicating with mom she stated she would pick up the patient at 3:30 PM on September 22, 2018.

## 2018-09-22 NOTE — ED Notes (Signed)
Pt given lunch tray.

## 2018-09-22 NOTE — TOC Initial Note (Signed)
Transition of Care [facility]) - Initial/Assessment Note    Patient Details  Name: Christine Oconnell MRN: [id number] Date of Birth: Feb 05, 1961  Transition of Care [facility]) CM/SW Contact:    Christine Butcher, RN Phone Number: 09/22/2018, 3:20 PM  Clinical Narrative:                 Patient is ready for discharge and her mother is coming to pick her up.  Patient was living at [facility] assisted living but she will not be going back she is going to live with her mother.  Mother requested information on personal care services.  PCS list printed and will be given to mother with discharge paperwork, mother also agrees to home health services and has no preference.  [facility] contacted and will accept the referral- Christine Oconnell notified.    Expected Discharge Plan: Home w Home Health Services Barriers to Discharge: No Barriers Identified   Patient Goals and CMS Choice   CMS Medicare.gov Compare Post Acute Care list provided to:: Legal Guardian(Mother) Choice offered to / list presented to : Parent  Expected Discharge Plan and Services Expected Discharge Plan: Home w Home Health Services Discharge Planning Services: CM Consult Post Acute Care Choice: Home Health Living arrangements for the past 2 months: Assisted Living Facility                     HH Arranged: RN, PT, Nurse's Aide, Social Work [facility] Agency: [facility]  Prior Living Arrangements/Services Living arrangements for the past 2 months: Assisted Living Facility Lives with:: Facility Resident          Need for Family Participation in Patient Care: Yes (Comment)(going to live with mother) Care giver support system in place?: Yes (comment)(Going to live with mother- [facility] services set up)      Activities of Daily Living      Permission Sought/Granted Permission sought to share information with : Facility Industrial/product designer granted to share information with : Yes, Verbal Permission Granted     Permission  granted to share info w AGENCY: Home Health agency- [facility]        Emotional Assessment Appearance:: Appears stated age Attitude/Demeanor/Rapport: (Dementia) Affect (typically observed): Agitated   Alcohol / Substance Use: Not Applicable Psych Involvement: Yes (comment)  Admission diagnosis:  EMS Behavioral Patient Active Problem List   Diagnosis Date Noted  . Early onset Alzheimer's disease with behavioral disturbance (HCC) 09/19/2018  . Alzheimer disease (HCC) 09/19/2018  . Aggressive behavior   . Hyperlipidemia 10/11/2013   PCP:  Christine Mould, NP Pharmacy:   [facility] [address],  - [address] [place] [address] Phone: [phone] Fax: [phone]     Social Determinants of Health (SDOH) Interventions    Readmission Risk Interventions  No flowsheet data found.

## 2018-09-22 NOTE — ED Notes (Addendum)
BEHAVIORAL HEALTH ROUNDING Patient sleeping: Yes.   Patient alert and oriented: eyes closed  Appears asleep Behavior appropriate: Yes.  ; If no, describe:  Nutrition and fluids offered: Yes  Toileting and hygiene offered: sleeping Sitter present: q 15 minute observations and security monitoring Law enforcement present: yes  ODS 

## 2018-09-22 NOTE — ED Notes (Signed)
Pt. Transferred to room 22 from room 25. Report to include Situation, Background, Assessment and Recommendations from [facility]. Pt. Oriented to unit including Q15 minute rounds as well as the security cameras for their protection. Patient is disoriented X 4, warm and dry in no acute distress. UTA SI, HI, and AVH due to patient's advance dementia. Pt. Encouraged to let me know if needs arise.

## 2018-09-22 NOTE — ED Notes (Signed)
Patient observed lying in bed with eyes closed  Even, unlabored respirations observed   NAD pt appears to be sleeping  I will continue to monitor along with every 15 minute visual observations and ongoing security monitoring    

## 2018-09-22 NOTE — ED Notes (Signed)
Pt assisted into her mothers car  - instructions reviewed and her mother verbalized agreement and understanding

## 2018-09-22 NOTE — ED Notes (Signed)
Per request of Pts guardian   Referral information for Psychiatric Hospitalization faxed to;   Marland Kitchen [facility]

## 2018-09-22 NOTE — ED Notes (Signed)
Awakened pt due to her needing her medications, VS and assessment  Pt verbally stating  "no - I am cold - let me sleep"  NP reports that pt is cleared by psychiatry and her mother will be here around 1530 to take her home   Pt informed and she then went back to sleep

## 2019-01-21 ENCOUNTER — Telehealth: Payer: Self-pay | Admitting: Primary Care

## 2019-01-21 NOTE — Telephone Encounter (Signed)
Spoke with patient's mother Sunday Corn and have scheduled a Telephone Palliative Consult for 01/26/19 @ 9 AM.

## 2019-01-26 ENCOUNTER — Other Ambulatory Visit: Payer: Self-pay

## 2019-01-26 ENCOUNTER — Other Ambulatory Visit: Payer: Medicare PPO | Admitting: Primary Care

## 2019-01-26 DIAGNOSIS — Z515 Encounter for palliative care: Secondary | ICD-10-CM

## 2019-01-26 NOTE — Progress Notes (Signed)
Therapist, [facility] Consult Note Telephone: [phone]  Fax: [phone]  TELEHEALTH VISIT STATEMENT Due to the COVID-19 crisis, this visit was done via telemedicine from my office. It was initiated and consented to by this patient and/or family.  PATIENT NAME: Christine Oconnell Droll DOB: [birth year] MRN: [id number]  PRIMARY CARE PROVIDER:   Titus MouldWhite, Elizabeth Burney, NP [phone]  REFERRING PROVIDER:  Weston AnnaKonz, Autumn Kari, PA-C [address] [place],  [place] [address] [phone]  RESPONSIBLE PARTY:   Extended Emergency Contact Information Primary Emergency Contact: Booth,Barbara S Address: [address]          [place], [place] [address] [place] Home Phone: [phone] Mobile Phone: [phone] Relation: Mother  Palliative Care was asked to follow this patient by consultation request of Weston AnnaKonz, Autumn Kari, PA-C. This is the initial visit.  ASSESSMENT AND RECOMMENDATIONS:   1. Goals of Care: Maximize quality of life and symptom management of agitation. Possible placement in memory care.  2. Symptom Management:   Agitation: Recommend increasing seroquel to 25 mg bid and then increase nighttime dose to 50 mg in another week. Agitation and combativeness has been an issue [facility], and ongoing at times. Recently improved with new protocol of seroquel 25 mg at hs and depakote 125 mg bid. Family currently caring for patient but they are not able to meet this need due to the agitation.  Since agitation is extreme and impacting quality of life, I recommend increasing seroquel as it's had some good effect and caregiver does not report any untoward effects.   Seizure disorder: Pt caregiver has d/c'ed keppra due to agitation from it. Currently she is on depakote and pcg states this should control seizures.Please review dose to include seizure control.   3. Family /Caregiver/Community Supports: Patient chief  caregiver  by mother, and daughter in law helps. However her agitation is increasing beyond their ability to provide round the clock care.  Looking for placement. Declined sw referral with palliative at this time, depite  no charge. States home health bill from March, I encouraged her to call insurance and home health agency. Also gave the resource of [employer] [facility] and number for more community resources.  4. Cognitive / Functional decline: FAST score 6, patient can talk but not purposefully. Able to feed self, walk for exercise.  5. Advanced Care Directive: HCPOA is mother. Has living will. Patient has 3 adult sons. Will discuss MOST at next visit after she locates living will.  6. Follow up Palliative Care Visit: Palliative care will continue to follow for goals of care clarification and symptom management. Return 4-6 weeks or prn.  I spent 60 minutes providing this consultation,  from 0910 to 1010. More than 50% of the time in this consultation was spent coordinating communication.   HISTORY OF PRESENT ILLNESS:  Christine Oconnell Kempker is a 58 y.o. year old female with multiple medical problems including early onset Alzheimers disease, HLD, seizure disorder. Palliative Care was asked to help address goals of care.   CODE STATUS: TBD, has living will  PPS: 50% HOSPICE ELIGIBILITY/DIAGNOSIS: no  PAST MEDICAL HISTORY:  Past Medical History:  Diagnosis Date  . Alzheimer's dementia (HCC)   . Hyperlipidemia     SOCIAL HX:  Social History   Tobacco Use  . Smoking status: Unknown If Ever Smoked  . Smokeless tobacco: Never Used  Substance Use Topics  . Alcohol use: Not Currently  ALLERGIES: No Known Allergies   PERTINENT MEDICATIONS:  Current Outpatient Medications:  .  ARIPiprazole (ABILIFY) 2 MG tablet, Take 2 mg by mouth daily., Disp: , Rfl:  .  vitamin Oconnell-12 (CYANOCOBALAMIN) 1000 MCG tablet, Take 3,000 mcg by mouth daily., Disp: , Rfl:  .  divalproex (DEPAKOTE SPRINKLE) 125 MG  capsule, Take 125 mg by mouth 2 (two) times daily., Disp: , Rfl:  .  escitalopram (LEXAPRO) 10 MG tablet, Take 10 mg by mouth daily. , Disp: , Rfl:  .  levETIRAcetam (KEPPRA) 500 MG tablet, Take 1 tablet (500 mg total) by mouth 2 (two) times daily. (Patient not taking: Reported on 01/26/2019), Disp: 60 tablet, Rfl: 0 .  lovastatin (MEVACOR) 20 MG tablet, Take 20 mg by mouth daily at 6 PM. , Disp: , Rfl:  .  memantine (NAMENDA) 10 MG tablet, Take 10 mg by mouth 2 (two) times daily. , Disp: , Rfl:  .  QUEtiapine (SEROQUEL) 25 MG tablet, Take 25 mg by mouth 2 (two) times daily. , Disp: , Rfl:    PHYSICAL EXAM/ROS:   Current and past weights: 170 lb., nl is 155 wt. General: NAD, WNWD,   Cardiovascular: no chest pain reported, no edema  Pulmonary: no cough, no increased SOB Abdomen: appetite good,  endorses constipation, incontinent of bowel at times GU: denies dysuria, incontinent of urineat times MSK:  no joint deformities, ambulatory Skin: no rashes or wounds reported Neurological: Weakness left side leg , h/o grand mal  seizure 6/2 at 3 am , now Rx for seizures, awake at night and up in home.  Cyndia Skeeters DNP AGPCNP-BC

## 2019-02-04 ENCOUNTER — Emergency Department
Admission: EM | Admit: 2019-02-04 | Discharge: 2019-02-04 | Disposition: A | Discharge status: Home / Self Care | Payer: Medicare PPO | Attending: Emergency Medicine | Admitting: Emergency Medicine

## 2019-02-04 ENCOUNTER — Encounter: Payer: Self-pay | Admitting: Emergency Medicine

## 2019-02-04 ENCOUNTER — Other Ambulatory Visit: Payer: Self-pay

## 2019-02-04 ENCOUNTER — Emergency Department: Payer: Medicare PPO

## 2019-02-04 DIAGNOSIS — K5900 Constipation, unspecified: Secondary | ICD-10-CM | POA: Diagnosis not present

## 2019-02-04 DIAGNOSIS — F0281 Dementia in other diseases classified elsewhere with behavioral disturbance: Secondary | ICD-10-CM | POA: Diagnosis not present

## 2019-02-04 DIAGNOSIS — G300 Alzheimer's disease with early onset: Secondary | ICD-10-CM | POA: Diagnosis not present

## 2019-02-04 DIAGNOSIS — N3001 Acute cystitis with hematuria: Secondary | ICD-10-CM | POA: Insufficient documentation

## 2019-02-04 DIAGNOSIS — K644 Residual hemorrhoidal skin tags: Secondary | ICD-10-CM | POA: Diagnosis not present

## 2019-02-04 DIAGNOSIS — R195 Other fecal abnormalities: Secondary | ICD-10-CM | POA: Diagnosis present

## 2019-02-04 DIAGNOSIS — Z79899 Other long term (current) drug therapy: Secondary | ICD-10-CM | POA: Insufficient documentation

## 2019-02-04 LAB — TYPE AND SCREEN
ABO/RH(D): B POS
Antibody Screen: NEGATIVE

## 2019-02-04 LAB — CBC
HCT: 42 % (ref 36.0–46.0)
Hemoglobin: 13.7 g/dL (ref 12.0–15.0)
MCH: 29.5 pg (ref 26.0–34.0)
MCHC: 32.6 g/dL (ref 30.0–36.0)
MCV: 90.3 fL (ref 80.0–100.0)
Platelets: 184 10*3/uL (ref 150–400)
RBC: 4.65 MIL/uL (ref 3.87–5.11)
RDW: 12.3 % (ref 11.5–15.5)
WBC: 8.6 10*3/uL (ref 4.0–10.5)
nRBC: 0 % (ref 0.0–0.2)

## 2019-02-04 LAB — URINALYSIS, COMPLETE (UACMP) WITH MICROSCOPIC
Bilirubin Urine: NEGATIVE
Glucose, UA: NEGATIVE mg/dL
Ketones, ur: NEGATIVE mg/dL
Nitrite: NEGATIVE
Protein, ur: 100 mg/dL — AB
RBC / HPF: >50 RBC/hpf — ABNORMAL HIGH (ref 0–5)
Specific Gravity, Urine: 1.016 (ref 1.005–1.030)
Squamous Epithelial / HPF: NONE SEEN (ref 0–5)
WBC, UA: >50 WBC/hpf — ABNORMAL HIGH (ref 0–5)
pH: 7 (ref 5.0–8.0)

## 2019-02-04 LAB — COMPREHENSIVE METABOLIC PANEL
ALT: 17 U/L (ref 0–44)
AST: 32 U/L (ref 15–41)
Albumin: 3.7 g/dL (ref 3.5–5.0)
Alkaline Phosphatase: 30 U/L — ABNORMAL LOW (ref 38–126)
Anion gap: 8 (ref 5–15)
BUN: 14 mg/dL (ref 6–20)
CO2: 24 mmol/L (ref 22–32)
Calcium: 9.1 mg/dL (ref 8.9–10.3)
Chloride: 107 mmol/L (ref 98–111)
Creatinine, Ser: 0.99 mg/dL (ref 0.44–1.00)
GFR calc Af Amer: >60 mL/min (ref >60)
GFR calc non Af Amer: >60 mL/min (ref >60)
Glucose, Bld: 123 mg/dL — ABNORMAL HIGH (ref 70–99)
Potassium: 4.5 mmol/L (ref 3.5–5.1)
Sodium: 139 mmol/L (ref 135–145)
Total Bilirubin: 1.1 mg/dL (ref 0.3–1.2)
Total Protein: 6.9 g/dL (ref 6.5–8.1)

## 2019-02-04 LAB — CBC WITH DIFFERENTIAL/PLATELET
Abs Immature Granulocytes: 0.02 10*3/uL (ref 0.00–0.07)
Basophils Absolute: 0 10*3/uL (ref 0.0–0.1)
Basophils Relative: 0 %
Eosinophils Absolute: 0 10*3/uL (ref 0.0–0.5)
Eosinophils Relative: 1 %
HCT: 44 % (ref 36.0–46.0)
Hemoglobin: 14.3 g/dL (ref 12.0–15.0)
Immature Granulocytes: 0 %
Lymphocytes Relative: 17 %
Lymphs Abs: 1.3 10*3/uL (ref 0.7–4.0)
MCH: 29.6 pg (ref 26.0–34.0)
MCHC: 32.5 g/dL (ref 30.0–36.0)
MCV: 91.1 fL (ref 80.0–100.0)
Monocytes Absolute: 0.6 10*3/uL (ref 0.1–1.0)
Monocytes Relative: 8 %
Neutro Abs: 5.6 10*3/uL (ref 1.7–7.7)
Neutrophils Relative %: 74 %
Platelets: 191 10*3/uL (ref 150–400)
RBC: 4.83 MIL/uL (ref 3.87–5.11)
RDW: 12.2 % (ref 11.5–15.5)
WBC: 7.6 10*3/uL (ref 4.0–10.5)
nRBC: 0 % (ref 0.0–0.2)

## 2019-02-04 MED ORDER — SODIUM CHLORIDE 0.9 % IV SOLN
2.0000 g | Freq: Once | INTRAVENOUS | Status: AC
  Administered 2019-02-04: 2 g via INTRAVENOUS
  Filled 2019-02-04: qty 20

## 2019-02-04 MED ORDER — DOCUSATE SODIUM 100 MG PO CAPS: 100.0000 mg | ORAL_CAPSULE | Freq: Every day | ORAL | 0 refills | Status: AC

## 2019-02-04 MED ORDER — IOHEXOL 240 MG/ML SOLN
50.0000 mL | Freq: Once | INTRAMUSCULAR | Status: AC | PRN
  Administered 2019-02-04: 50 mL via ORAL

## 2019-02-04 MED ORDER — CEPHALEXIN 500 MG PO CAPS: 500.0000 mg | ORAL_CAPSULE | Freq: Three times a day (TID) | ORAL | 0 refills | Status: AC

## 2019-02-04 MED ORDER — POLYETHYLENE GLYCOL 3350 17 GM/SCOOP PO POWD: 17.0000 g | Freq: Every day | ORAL | 0 refills | Status: DC | PRN

## 2019-02-04 MED ORDER — SODIUM CHLORIDE 0.9 % IV BOLUS
1000.0000 mL | Freq: Once | INTRAVENOUS | Status: AC
  Administered 2019-02-04: 1000 mL via INTRAVENOUS

## 2019-02-04 MED ORDER — IOHEXOL 300 MG/ML  SOLN
100.0000 mL | Freq: Once | INTRAMUSCULAR | Status: AC | PRN
  Administered 2019-02-04: 100 mL via INTRAVENOUS

## 2019-02-04 NOTE — ED Notes (Signed)
Updated mother Pamala Hurry again.

## 2019-02-04 NOTE — ED Notes (Signed)
Pt placed in car with mother. All questions answered.

## 2019-02-04 NOTE — ED Notes (Signed)
NT walking by room and pt pulling all wires off and pulling at IV.  Charge RN informed and melody NT sitting with pt.

## 2019-02-04 NOTE — ED Notes (Signed)
Reviewed discharge with pt mother, legal guardian.  She will come pick pt up, will leave pt in room until arrives.  Will have her sign paper discharge form.

## 2019-02-04 NOTE — ED Notes (Signed)
Patient transported to CT 

## 2019-02-04 NOTE — ED Provider Notes (Signed)
[facility] Emergency Department Provider Note  ____________________________________________   First MD Initiated Contact with Patient 02/04/19 1111     (approximate)  I have reviewed the triage vital signs and the nursing notes.   HISTORY  Chief Complaint GI Bleeding    HPI Christine Oconnell is a 58 y.o. female with history of early onset Alzheimer's here with blood in the stool.  Per report, the patient was found to have intermittent bright red blood in her stool for the last several days.  She reportedly had spontaneous blood in her underwear this morning.  Mother subsequently sent her for evaluation.  She also intermittently complained of some abdominal pain.  On my assessment, the patient is very pleasant, but severely demented.  She is unable to provide any history.  Mother does not recall any fevers.  No sick contacts in the home.     Level 5 caveat invoked as remainder of history, ROS, and physical exam limited due to patient's dementia.   Past Medical History:  Diagnosis Date  . Alzheimer's dementia ([place])   . Hyperlipidemia     Patient Active Problem List   Diagnosis Date Noted  . Early onset Alzheimer's disease with behavioral disturbance ([place]) 09/19/2018  . Alzheimer disease ([place]) 09/19/2018  . Aggressive behavior   . Hyperlipidemia 10/11/2013    Past Surgical History:  Procedure Laterality Date  . ABDOMINAL HYSTERECTOMY      Prior to Admission medications   Medication Sig Start Date End Date Taking? Authorizing Provider  ARIPiprazole (ABILIFY) 2 MG tablet Take 4 mg by mouth daily. 02/02/19 02/02/20 Yes [provider]  divalproex (DEPAKOTE) 125 MG DR tablet Take 125 mg by mouth 2 (two) times a day. 01/25/19 02/24/19 Yes [provider]  escitalopram (LEXAPRO) 10 MG tablet Take 10 mg by mouth daily.  07/07/18  Yes [provider]  lovastatin (MEVACOR) 20 MG tablet Take 20 mg by mouth daily at 6 PM.  06/01/18  Yes  [provider]  memantine (NAMENDA) 10 MG tablet Take 10 mg by mouth 2 (two) times daily.  05/04/18  Yes [provider]  QUEtiapine (SEROQUEL) 25 MG tablet Take 25 mg by mouth 2 (two) times daily.  06/04/18  Yes [provider]  vitamin B-12 (CYANOCOBALAMIN) 1000 MCG tablet Take 3,000 mcg by mouth daily.   Yes [provider]  cephALEXin (KEFLEX) 500 MG capsule Take 1 capsule (500 mg total) by mouth 3 (three) times daily for 7 days. 02/04/19 02/11/19  Duffy Bruce, MD  levETIRAcetam (KEPPRA) 500 MG tablet Take 1 tablet (500 mg total) by mouth 2 (two) times daily. Patient not taking: Reported on 01/26/2019 07/26/18   Darel Hong, MD    Allergies Patient has no known allergies.  Family History  Problem Relation Age of Onset  . Breast cancer Maternal Grandmother 85       mat great gm  . Breast cancer Cousin 26    Social History Social History   Tobacco Use  . Smoking status: Unknown If Ever Smoked  . Smokeless tobacco: Never Used  Substance Use Topics  . Alcohol use: Not Currently  . Drug use: Never    Review of Systems  Review of Systems  Unable to perform ROS: Dementia     ____________________________________________  PHYSICAL EXAM:      VITAL SIGNS: ED Triage Vitals  Enc Vitals Group     BP 02/04/19 1011 115/67     Pulse Rate 02/04/19 1011  67     Resp 02/04/19 1011 15     Temp 02/04/19 1011 98.3 F (36.8 C)     Temp Source 02/04/19 1011 Oral     SpO2 02/04/19 1011 100 %     Weight 02/04/19 1008 170 lb (77.1 kg)     Height --      Head Circumference --      Peak Flow --      Pain Score --      Pain Loc --      Pain Edu? --      Excl. in GC? --      Physical Exam Vitals signs and nursing note reviewed.  Constitutional:      General: She is not in acute distress.    Appearance: She is well-developed.  HENT:     Head: Normocephalic and atraumatic.  Eyes:     Conjunctiva/sclera: Conjunctivae normal.  Neck:      Musculoskeletal: Neck supple.  Cardiovascular:     Rate and Rhythm: Normal rate and regular rhythm.     Heart sounds: Normal heart sounds. No murmur. No friction rub.  Pulmonary:     Effort: Pulmonary effort is normal. No respiratory distress.     Breath sounds: Normal breath sounds. No wheezing or rales.  Abdominal:     General: Abdomen is flat. There is no distension.     Palpations: Abdomen is soft.     Tenderness: There is abdominal tenderness (Minimal, suprapubic).  Genitourinary:    Comments: Multiple nonthrombosed external hemorrhoids noted Skin:    General: Skin is warm.     Capillary Refill: Capillary refill takes less than 2 seconds.  Neurological:     Mental Status: She is alert. She is disoriented.     Motor: No abnormal muscle tone.     Comments: Demented, disoriented.  Follows commands intermittently.  Pleasant.  Moves all extremities.       ____________________________________________   LABS (all labs ordered are listed, but only abnormal results are displayed)  Labs Reviewed  COMPREHENSIVE METABOLIC PANEL - Abnormal; Notable for the following components:      Result Value   Glucose, Bld 123 (*)    Alkaline Phosphatase 30 (*)    All other components within normal limits  URINALYSIS, COMPLETE (UACMP) WITH MICROSCOPIC - Abnormal; Notable for the following components:   Color, Urine YELLOW (*)    APPearance CLOUDY (*)    Hgb urine dipstick LARGE (*)    Protein, ur 100 (*)    Leukocytes,Ua MODERATE (*)    RBC / HPF >50 (*)    WBC, UA >50 (*)    Bacteria, UA RARE (*)    All other components within normal limits  URINE CULTURE  CBC  CBC WITH DIFFERENTIAL/PLATELET  PROTIME-INR  APTT  TYPE AND SCREEN    ____________________________________________  EKG: Normal sinus rhythm, ventricular rate 86.  No acute ischemic changes.  Baseline wander. ________________________________________  RADIOLOGY All imaging, including plain films, CT scans, and  ultrasounds, independently reviewed by me, and interpretations confirmed via formal radiology reads.  ED MD interpretation:   CT scan pending  Official radiology report(s): No results found.  ____________________________________________  PROCEDURES   Procedure(s) performed (including Critical Care):  Procedures  ____________________________________________  INITIAL IMPRESSION / MDM / ASSESSMENT AND PLAN / ED COURSE  As part of my medical decision making, I reviewed the following data within the electronic MEDICAL RECORD NUMBER Notes from prior ED visits and [place] Controlled Substance  Database      *Marlene Bastngela B Teem was evaluated in Emergency Department on 02/04/2019 for the symptoms described in the history of present illness. She was evaluated in the context of the global COVID-19 pandemic, which necessitated consideration that the patient might be at risk for infection with the SARS-CoV-2 virus that causes COVID-19. Institutional protocols and algorithms that pertain to the evaluation of patients at risk for COVID-19 are in a state of rapid change based on information released by regulatory bodies including the CDC and federal and state organizations. These policies and algorithms were followed during the patient's care in the ED.  Some ED evaluations and interventions may be delayed as a result of limited staffing during the pandemic.*      Medical Decision Making: 58 year old female here with reported blood per rectum.  Hemoglobin is stable here on initial and repeat, making significant GI bleed unlikely.  I suspect this could be secondary to moderate baseline constipation with external hemorrhoids, but given limited history will obtain CT scan.  Patient also has a significant UTI.  This could certainly be contributing to some of the blood in her diaper.  Will start on Rocephin and treat empirically as an outpatient.  No evidence of sepsis.  Suspect she can be managed as an outpatient pending  CT results.  ____________________________________________  FINAL CLINICAL IMPRESSION(S) / ED DIAGNOSES  Final diagnoses:  External hemorrhoids  Acute cystitis with hematuria     MEDICATIONS GIVEN DURING THIS VISIT:  Medications  sodium chloride 0.9 % bolus 1,000 mL (1,000 mLs Intravenous New Bag/Given 02/04/19 1413)  iohexol (OMNIPAQUE) 240 MG/ML injection 50 mL (50 mLs Oral Contrast Given 02/04/19 1413)  cefTRIAXone (ROCEPHIN) 2 g in sodium chloride 0.9 % 100 mL IVPB (2 g Intravenous New Bag/Given 02/04/19 1454)  iohexol (OMNIPAQUE) 300 MG/ML solution 100 mL (100 mLs Intravenous Contrast Given 02/04/19 1520)     ED Discharge Orders         Ordered    cephALEXin (KEFLEX) 500 MG capsule  3 times daily     02/04/19 1533           Note:  This document was prepared using Dragon voice recognition software and may include unintentional dictation errors.   Shaune PollackIsaacs, Jovaughn Wojtaszek, MD 02/04/19 [phone]

## 2019-02-04 NOTE — ED Notes (Signed)
Bed alarm heard. Pt redirected easily at this time from getting up.  Mittens placed on hands as pt is trying to pick at coban and IV.

## 2019-02-04 NOTE — ED Notes (Signed)
Pt cleaned and new diaper/linen placed. MD at bedside for rectal exam.

## 2019-02-04 NOTE — ED Provider Notes (Signed)
-----------------------------------------   4:56 PM on 02/04/2019 -----------------------------------------  Patient care assumed from Dr. Ellender Hose.  Patient CT scan has resulted showing constipation with rectosigmoid fecal impaction.  I perform a rectal exam myself, there is no palpable fecal impaction in the rectum, soft stool present.  We will discharge patient home with Keflex to cover for urinary tract infection and MiraLAX for constipation.  Patient will follow-up with her PCP.   Harvest Dark, MD 02/04/19 [phone]

## 2019-02-04 NOTE — ED Triage Notes (Signed)
Pt here for blood in stool.  Called and spoke with mother. Mother reports bright red blood in stools; a lot present this morning. Over last 2 days just small amounts of blood noted.  Pt has significant dementia.

## 2019-02-04 NOTE — ED Notes (Signed)
Fall mats in place, posey alarm under pt, verified it working correctly.

## 2019-02-04 NOTE — ED Notes (Addendum)
Pt had bowel movement. Diaper changed. Clothes placed on patient and ready for discharge when mother arrives.

## 2019-02-04 NOTE — ED Notes (Signed)
Pt alert. Off monitor as it agitates pt more.  Waiting on disposition.

## 2019-02-04 NOTE — ED Notes (Signed)
In and out cath done by [facility] RN, sterile technique maintained. This RN at bedside.

## 2019-02-06 LAB — URINE CULTURE
Culture: >=100000 — AB
Special Requests: NORMAL

## 2019-03-01 ENCOUNTER — Telehealth: Payer: Self-pay | Admitting: Primary Care

## 2019-03-01 NOTE — Telephone Encounter (Signed)
T/c to family to discuss follow up visit. She is looking at a memory care but her outbursts continue. She is pacing, refusing personal care. I made a visit scheduled for Wed.

## 2019-03-03 ENCOUNTER — Other Ambulatory Visit: Payer: Self-pay

## 2019-03-03 ENCOUNTER — Other Ambulatory Visit: Payer: Medicare PPO | Admitting: Primary Care

## 2019-03-03 DIAGNOSIS — Z515 Encounter for palliative care: Secondary | ICD-10-CM

## 2019-03-03 NOTE — Progress Notes (Signed)
/    [facility] Consult Note Telephone: [phone]  Fax: [phone]  PATIENT NAME: Christine Oconnell DOB: [birth year] MRN: [id number]  PRIMARY CARE PROVIDER:   [place], [place] [address] [phone]  REFERRING PROVIDER:  [facility] [address] [place],  [place] [address] [phone]  RESPONSIBLE PARTY:   Extended Emergency Contact Information Primary Emergency Contact: Christine Oconnell Address: [address]          [place], [place] [address] Johnnette Litter of [place] Phone: [phone] Mobile Phone: [phone] Relation: Mother  ASSESSMENT AND RECOMMENDATIONS:   1. Advance Care Planning/Goals of Care: Goals include to maximize quality of life and symptom management. ACP not discussed at this visit. 58 year old woman with dx of early onset AD in 2015, with sx starting in 2014. She is currently ambulatory but needs assistance in all IADLs and most adls. I observed her trying to drink from a travel mug and she had difficulty bringing cup to her lips. That said she can sit at a table, look at magazines and interact verbally with animation; the sentences re sometimes intact but most of the time not, and not pertinent. She tolerates my interview in her home setting well.  2. Symptom Management:   Behavior: Recommend quetiapine 50 mg at hs and 25 mg in the AM and then add to 50 mg in the am. This was startedin 07/2017 and has been well tolerated. Recommend to [employer] Abilify, as they are having coverage issues,, and perhaps d/c.She is currently taking 2 mg but I recommend using the quetiapine as she's tolerating it well.  Due to coverage denial for 4 mg abilify, Pharmacy suggested risperidone in lieu, and it could be useful prn. She seems to have escalating behaviors towards evenings some days but can also be very resistant to am care.  She is very unmanageable at home and family is looking  for placement. Patient will need to have some of these dementia behaviors managed before she can be placed. She was already dismissed from a nearby facility.  Finances: Mother is applying for her for medicaid for memory care.  Constipation: Recommend adding miralax to regimen. Patient is constipated and has behavior issues around bathroom time as well. She does not like being cleaned of excrement and often fights her caregivers about this.  3. Family /Caregiver/Community Supports: Lives with parents, has a brother and 3 sons. Caregiving is very stressful as patient has combative behavoirs and also wanders.  4. Follow up Palliative Care Visit: Palliative care will continue to follow for goals of care clarification and symptom management. Return 2 weeks or prn to assess effect of increase of quetiapine.  I spent 60 minutes providing this consultation,  from 1530 to 1630. More than 50% of the time in this consultation was spent coordinating communication.   HISTORY OF PRESENT ILLNESS:  Christine Oconnell is a 58 y.o. year old female with multiple medical problems including frontotemporal dementia with behavior disturbances. Palliative Care was asked to follow this patient by consultation request of ASunday Corn NP to help address advance care planning and goals of care. This is a follow up visit.  CODE STATUS: TBD  PPS: 50% HOSPICE ELIGIBILITY/DIAGNOSIS: no PAST MEDICAL HISTORY:  Past Medical History:  Diagnosis Date  . Alzheimer's dementia ([place])   . Hyperlipidemia     SOCIAL HX:  Social History   Tobacco Use  . Smoking status: Unknown  If Ever Smoked  . Smokeless tobacco: Never Used  Substance Use Topics  . Alcohol use: Not Currently    ALLERGIES: No Known Allergies   PERTINENT MEDICATIONS:  Outpatient Encounter Medications as of 03/03/2019  Medication Sig  . ARIPiprazole (ABILIFY) 2 MG tablet Take 4 mg by mouth daily.  . divalproex (DEPAKOTE) 125 MG DR tablet Take 125 mg by mouth 2 (two)  times a day.  . escitalopram (LEXAPRO) 10 MG tablet Take 10 mg by mouth daily.   Marland Kitchen. levETIRAcetam (KEPPRA) 500 MG tablet Take 1 tablet (500 mg total) by mouth 2 (two) times daily. (Patient not taking: Reported on 01/26/2019)  . lovastatin (MEVACOR) 20 MG tablet Take 20 mg by mouth daily at 6 PM.   . memantine (NAMENDA) 10 MG tablet Take 10 mg by mouth 2 (two) times daily.   . polyethylene glycol powder (GLYCOLAX/MIRALAX) 17 GM/SCOOP powder Take 17 g by mouth daily as needed for moderate constipation.  . QUEtiapine (SEROQUEL) 25 MG tablet Take 25 mg by mouth 2 (two) times daily.   . vitamin B-12 (CYANOCOBALAMIN) 1000 MCG tablet Take 3,000 mcg by mouth daily.   No facility-administered encounter medications on file as of 03/03/2019.     PHYSICAL EXAM / ROS:   Current and past weights:  General: NAD, frail appearing, thin Cardiovascular: no chest pain reported, no edema,  Pulmonary: no cough, no increased SOB Abdomen: appetite fair, endorses constipation, incontinent of bowel GU: denies dysuria, incontinent of urine MSK:  no joint deformities, ambulatory without device Skin: no rashes or wounds reported Neurological: Early onset dementia, very verbal and talkative but nothing is reliable. Family reports much problem with behaviors, fighting and pinching, esp. with clothing changes, bathing and toileting. Mother states she sleeps well. Had h/o migraine headaches. Mother state FT disease but documentation is all early onset AD.  Marijo FileKathryn M Cletus Paris DNP AGPCNP-BC   COVID-19 PATIENT SCREENING TOOL  Person answering questions: ________mother___________ _____   1.  Is the patient or any family member in the home showing any signs or symptoms regarding respiratory infection?               Person with Symptom- _____NA______________________  a. Fever                                                                          Yes___ No___          ___________________  b. Shortness of breath                                                     Yes___ No___          ___________________ c. Cough/congestion                                       Yes___  No___         ___________________ d. Body aches/pains  Yes___ No___        ____________________ e. Gastrointestinal symptoms (diarrhea, nausea)           Yes___ No___        ____________________  2. Within the past 14 days, has anyone living in the home had any contact with someone with or under investigation for COVID-19?    Yes___ No__x   Person __________________

## 2019-03-04 ENCOUNTER — Telehealth: Payer: Self-pay | Admitting: Primary Care

## 2019-03-04 NOTE — Telephone Encounter (Signed)
T/c from Mother, patient took 50 mg of seroquel in the pm last night but was awake some in the night. We discussed titration of dose. We also discussed the abilify which mother states she did not really see that it helped. She may wean it or she may increase to 4 mg and see if that improves things. 2 mg has not made much of a difference. We also discussed giving miralax as needed for constipation. I gave her the website for AD asso and asked her to look for infor about early onset AD and maybe connect with support group. Patient's mother is very Pharmacologist and may benefit from some online resources. Return visit/call in 1-2 weeks or PRN

## 2019-03-08 ENCOUNTER — Telehealth: Payer: Self-pay | Admitting: Primary Care

## 2019-03-08 NOTE — Telephone Encounter (Signed)
T/c from mother, patient had a large seizure over the weekend and is now in [facility]. She said they were going to regulate her meds and hopefully find a placement for her. She said she would check back with me when she knew more of a plan.

## 2019-03-12 ENCOUNTER — Telehealth: Payer: Self-pay

## 2019-03-12 ENCOUNTER — Ambulatory Visit
Admission: EM | Admit: 2019-03-12 | Discharge: 2019-03-12 | Disposition: A | Discharge status: Home / Self Care | Payer: Medicare PPO | Attending: Family Medicine | Admitting: Family Medicine

## 2019-03-12 ENCOUNTER — Other Ambulatory Visit: Payer: Self-pay

## 2019-03-12 DIAGNOSIS — Z111 Encounter for screening for respiratory tuberculosis: Secondary | ICD-10-CM

## 2019-03-12 NOTE — ED Triage Notes (Signed)
Patient here for PPD reading.  PPD test was placed on 03/10/19 in left arm at 1536 at an outside facility.  PPD results were 1mm Negative at 03/12/19 at 1554.

## 2019-03-12 NOTE — Telephone Encounter (Signed)
Telephone call to home, spoke with patient's mother.  RN offered to schedule time to make home visit to read PPD.  Patient's mother states paperwork that needs to be filed out for PPD was sent to urgent care.  Patient's mother states she is going to take patient to urgen care to have PPS read.  Maxwell Caul RN requested RN make contact with family to schedule time to make viist.  RN emailed Inez Catalina and informed her patient was going to urgent care to have PPD read.

## 2019-03-17 ENCOUNTER — Telehealth: Payer: Self-pay

## 2019-03-17 NOTE — Telephone Encounter (Signed)
Phone call placed to patient's mom, Christine Oconnell, to check in and to offer to schedule a follow up with NP. Christine Oconnell shared that she has been looking for an Assisted Living but has not been successful. Offered to request Palliative SW to contact her to assist. Christine Oconnell declined need for this at this time. Christine Oconnell shared that she has Social worker coming in on Friday. Christine Oconnell to call back Palliative when she is ready to schedule visit.

## 2019-03-29 ENCOUNTER — Telehealth: Payer: Self-pay | Admitting: Primary Care

## 2019-03-29 NOTE — Telephone Encounter (Signed)
T/c to set up palliative appointment. Discussed her recent medication changes.

## 2019-03-31 ENCOUNTER — Other Ambulatory Visit: Payer: Self-pay

## 2019-03-31 ENCOUNTER — Other Ambulatory Visit: Payer: Medicare PPO | Admitting: Primary Care

## 2019-03-31 DIAGNOSIS — Z515 Encounter for palliative care: Secondary | ICD-10-CM

## 2019-03-31 NOTE — Progress Notes (Signed)
[facility] Consult Note Telephone: [phone]  Fax: [phone]   PATIENT NAME: Christine Oconnell [place] [address] [phone] (home)  DOB: 1960/11/15 MRN: [id number]  PRIMARY CARE PROVIDER:   Ricardo Jericho, NP, [place] [address] [phone]  REFERRING PROVIDER:  Ricardo Jericho, [place] [address] [place],  [place] [address] [phone]  RESPONSIBLE PARTY:   Extended Emergency Contact Information Primary Emergency Contact: Katherine Basset Address: [address]          [place], [place] [address] Johnnette Litter of [place] Phone: [phone] Mobile Phone: [phone] Relation: Mother   ASSESSMENT AND RECOMMENDATIONS:   1. Advance Care Planning/Goals of Care: Goals include to maximize quality of life and symptom management. We discussed MOST Form and mother got out their living wills. MOST prepared and uploaded to [facility]. Selected CPR, limited intervention, antibiotic use, limited IV use, no feeding tube.  2. Symptom Management:   Agitation: Ongoing issue, recently hospitalized for med assessment. HS Seroquel has seemed to help. Screening medicaid MD had concerns about both trazodone and risperidone. She is not currently giving trazodone. Patient is often awake in the night but not disruptive. We also discussed dementia sleep pattern interruptions.  She could maybe could benefit from  Increased dosing of hs risperidone if needed for sleep. She may also be having hallucinations as she has been  talking to the TV and to furniture.  Upcoming lab work: After hospital home visit from hospital. For blood work 10/1. Needs Emla cream prior to blood draw. Consider blood Quanteferon Gold at next blood draw for nursing home placement.   Placement: Mother has continued to pursue medicaid and placement.  We also discussed PACE services. Has had 2 TB and 2 covid tests recently  but now out of date.  Patient should have quanteferon if ALF accepts this test.  3. Family /Caregiver/Community Supports: Living with elderly parents, her children help with her from time to time. We discussed community based services. They are hiring some intermittent caregiving help at this time.  4. Cognitive / Functional decline: At recent baseline, sitting at table and participating sometimes verbally but words are nonsensical. Ambulating holding to her mother, suggested rollator. They are using a standard walker now.  5. Follow up Palliative Care Visit: Palliative care will continue to follow for goals of care clarification and symptom management. Return 2-4 weeks or prn.  I spent 60 minutes providing this consultation,  from 1000 to 1100. More than 50% of the time in this consultation was spent coordinating communication.   HISTORY OF PRESENT ILLNESS:  Christine Oconnell is a 58 y.o. year old female with multiple medical problems including agitation, combative behavior, seizures,. Palliative Care was asked to follow this patient by consultation request of Dema Severin, Orlene Och* to help address advance care planning and goals of care. This is a follow up visit.  CODE STATUS: FULL CODE, limited hospital interventions.  PPS: 30% HOSPICE ELIGIBILITY/DIAGNOSIS: TBD  PAST MEDICAL HISTORY:  Past Medical History:  Diagnosis Date  . Alzheimer's dementia ([place])   . Hyperlipidemia     SOCIAL HX:  Social History   Tobacco Use  . Smoking status: Unknown If Ever Smoked  . Smokeless tobacco: Never Used  Substance Use Topics  . Alcohol use: Not Currently    ALLERGIES: No Known Allergies   PERTINENT MEDICATIONS:  Outpatient Encounter Medications as of 03/31/2019  Medication Sig  .  divalproex (DEPAKOTE SPRINKLE) 125 MG capsule Take 125 mg by mouth 2 (two) times daily.  . divalproex (DEPAKOTE) 125 MG DR tablet Take 125 mg by mouth 2 (two) times daily.   Marland Kitchen escitalopram (LEXAPRO) 10 MG tablet  Take 10 mg by mouth daily.   Marland Kitchen lovastatin (MEVACOR) 20 MG tablet Take 20 mg by mouth daily at 6 PM.   . polyethylene glycol powder (GLYCOLAX/MIRALAX) 17 GM/SCOOP powder Take 17 g by mouth daily as needed for moderate constipation.  . risperiDONE (RISPERDAL) 0.25 MG tablet Take 0.25 mg by mouth at bedtime.  . traZODone (DESYREL) 50 MG tablet Take 50 mg by mouth at bedtime. As needed  . vitamin B-12 (CYANOCOBALAMIN) 1000 MCG tablet Take 3,000 mcg by mouth daily.  . ARIPiprazole (ABILIFY) 2 MG tablet Take 4 mg by mouth daily.  Marland Kitchen levETIRAcetam (KEPPRA) 500 MG tablet Take 1 tablet (500 mg total) by mouth 2 (two) times daily. (Patient not taking: Reported on 01/26/2019)  . memantine (NAMENDA) 10 MG tablet Take 10 mg by mouth 2 (two) times daily.   . QUEtiapine (SEROQUEL) 50 MG tablet Take 50 mg by mouth at bedtime. Taking one at hs. Family is giving 25 mg during day prn   No facility-administered encounter medications on file as of 03/31/2019.      PHYSICAL EXAM / ROS:   Current and past weights: unavailable General: NAD, WNWD Cardiovascular: no chest pain reported, no edema,  Pulmonary: no cough, no increased SOB, no DOE Abdomen: appetite good, no constipation, continent of bowel GU: denies dysuria, incontinent of urine MSK:  no joint deformities, ambulatory with asistance Skin: no rashes or wounds reported Neurological: Weakness, moderate to advanced dementia.  Marijo File DNP AGPCNP-BC COVID-19 PATIENT SCREENING TOOL  Person answering questions: ____mother_______________ _____   1.  Is the patient or any family member in the home showing any signs or symptoms regarding respiratory infection? na              Person with Symptom- ___________________________  a. Fever                                                                          Yes___ No___          ___________________  b. Shortness of breath                                                    Yes___ No___           ___________________ c. Cough/congestion                                       Yes___  No___         ___________________ d. Body aches/pains  Yes___ No___        ____________________ e. Gastrointestinal symptoms (diarrhea, nausea)           Yes___ No___        ____________________  2. Within the past 14 days, has anyone living in the home had any contact with someone with or under investigation for COVID-19?    Yes___ No_x_   Person __________________

## 2019-04-23 ENCOUNTER — Emergency Department
Admission: EM | Admit: 2019-04-23 | Discharge: 2019-04-23 | Disposition: A | Discharge status: Home / Self Care | Payer: Medicare PPO | Attending: Emergency Medicine | Admitting: Emergency Medicine

## 2019-04-23 ENCOUNTER — Other Ambulatory Visit: Payer: Self-pay

## 2019-04-23 ENCOUNTER — Encounter: Payer: Self-pay | Admitting: Emergency Medicine

## 2019-04-23 DIAGNOSIS — Z79899 Other long term (current) drug therapy: Secondary | ICD-10-CM | POA: Insufficient documentation

## 2019-04-23 DIAGNOSIS — R569 Unspecified convulsions: Secondary | ICD-10-CM | POA: Insufficient documentation

## 2019-04-23 DIAGNOSIS — F0281 Dementia in other diseases classified elsewhere with behavioral disturbance: Secondary | ICD-10-CM | POA: Insufficient documentation

## 2019-04-23 DIAGNOSIS — R451 Restlessness and agitation: Secondary | ICD-10-CM | POA: Insufficient documentation

## 2019-04-23 DIAGNOSIS — G309 Alzheimer's disease, unspecified: Secondary | ICD-10-CM | POA: Insufficient documentation

## 2019-04-23 HISTORY — DX: Unspecified convulsions: R56.9

## 2019-04-23 MED ORDER — DIVALPROEX SODIUM 125 MG PO CSDR: 250.0000 mg | DELAYED_RELEASE_CAPSULE | Freq: Two times a day (BID) | ORAL | 0 refills | Status: DC

## 2019-04-23 MED ORDER — DIVALPROEX SODIUM 125 MG PO CSDR
500.0000 mg | DELAYED_RELEASE_CAPSULE | Freq: Every evening | ORAL | Status: DC
  Filled 2019-04-23: qty 4

## 2019-04-23 MED ORDER — DIVALPROEX SODIUM 125 MG PO CSDR
250.0000 mg | DELAYED_RELEASE_CAPSULE | Freq: Every day | ORAL | Status: DC
  Administered 2019-04-23: 250 mg via ORAL
  Filled 2019-04-23: qty 2

## 2019-04-23 MED ORDER — QUETIAPINE FUMARATE 25 MG PO TABS: 50.0000 mg | ORAL_TABLET | Freq: Every day | ORAL | Status: DC

## 2019-04-23 MED ORDER — RISPERIDONE 0.25 MG PO TABS
0.2500 mg | ORAL_TABLET | Freq: Every day | ORAL | Status: DC | PRN
  Administered 2019-04-23: 0.25 mg via ORAL
  Filled 2019-04-23 (×2): qty 1

## 2019-04-23 MED ORDER — ESCITALOPRAM OXALATE 10 MG PO TABS: 10.0000 mg | ORAL_TABLET | Freq: Every day | ORAL | Status: DC

## 2019-04-23 MED ORDER — QUETIAPINE FUMARATE 25 MG PO TABS: 25.0000 mg | ORAL_TABLET | Freq: Every day | ORAL | Status: DC | PRN

## 2019-04-23 NOTE — Discharge Instructions (Addendum)
We discussed with your neurologist who recommended increasing the Depakote.  Increase to 250mg  in AM and 500mg  (two pills) in PM for one week. Then increase to 500mg  in AM and 500mg  in PM.  I prescribed you an additional prescription to make sure that you have enough.  You should follow-up with neurologist to continue to work on placement.  There is no evidence of large tongue laceration.  Vital signs were reassuring.  We discussed not doing additional labs due to recent labs being reassuring.

## 2019-04-23 NOTE — ED Provider Notes (Signed)
[facility] Emergency Department Provider Note  ____________________________________________   First MD Initiated Contact with Patient 04/23/19 [phone]     (approximate)  I have reviewed the triage vital signs and the nursing notes.   HISTORY  Chief Complaint Seizures   HPI Christine Oconnell is a 58 y.o. female with Alzheimer's dementia with issues with agitation and seizures on Depakote who presents with seizures. Per EMS pt had a seizure today lasting a few minutes, no interventions given, stopped on its own, full body shaking, nothing seemed to bring it on.    Discussed with Christine Oconnell pts mom- normal combative in the morning. Went to bed at 8pm. She has been getting the depakote at home. She heard a scream and then she went in and her arms were extended up and stiff and was still screaming, then eyes closed and blowing out of months.  Last seizure august 29. Did not hit her head, in bed with pillows. Depakote being prescribed by Dr. Clelia Croft at [facility]. Seizures maybe about once/ month.  They are working on getting placement for patient outpatient.     Past Medical History:  Diagnosis Date  . Alzheimer's dementia (HCC)   . Hyperlipidemia     Patient Active Problem List   Diagnosis Date Noted  . Early onset Alzheimer's disease with behavioral disturbance (HCC) 09/19/2018  . Alzheimer disease (HCC) 09/19/2018  . Aggressive behavior   . Hyperlipidemia 10/11/2013    Past Surgical History:  Procedure Laterality Date  . ABDOMINAL HYSTERECTOMY      Prior to Admission medications   Medication Sig Start Date End Date Taking? Authorizing Provider  ARIPiprazole (ABILIFY) 2 MG tablet Take 4 mg by mouth daily. 02/02/19 02/02/20  [provider]  divalproex (DEPAKOTE SPRINKLE) 125 MG capsule Take 125 mg by mouth 2 (two) times daily.    [provider]  divalproex (DEPAKOTE) 125 MG DR tablet Take 125 mg by mouth 2 (two) times daily.  03/22/19    [provider]  escitalopram (LEXAPRO) 10 MG tablet Take 10 mg by mouth daily.  07/07/18   [provider]  levETIRAcetam (KEPPRA) 500 MG tablet Take 1 tablet (500 mg total) by mouth 2 (two) times daily. Patient not taking: Reported on 01/26/2019 07/26/18   Merrily Brittle, MD  lovastatin (MEVACOR) 20 MG tablet Take 20 mg by mouth daily at 6 PM.  06/01/18   [provider]  memantine (NAMENDA) 10 MG tablet Take 10 mg by mouth 2 (two) times daily.  05/04/18   [provider]  polyethylene glycol powder (GLYCOLAX/MIRALAX) 17 GM/SCOOP powder Take 17 g by mouth daily as needed for moderate constipation. 02/04/19   Minna Antis, MD  QUEtiapine (SEROQUEL) 50 MG tablet Take 50 mg by mouth at bedtime. Taking one at hs. Family is giving 25 mg during day prn 06/04/18   [provider]  risperiDONE (RISPERDAL) 0.25 MG tablet Take 0.25 mg by mouth at bedtime.    [provider]  traZODone (DESYREL) 50 MG tablet Take 50 mg by mouth at bedtime. As needed    [provider]  vitamin B-12 (CYANOCOBALAMIN) 1000 MCG tablet Take 3,000 mcg by mouth daily.    [provider]    Allergies Patient has no known allergies.  Family History  Problem Relation Age of Onset  . Breast cancer Maternal Grandmother 85       mat great gm  . Breast cancer Cousin 74    Social History  Social History   Tobacco Use  . Smoking status: Unknown If Ever Smoked  . Smokeless tobacco: Never Used  Substance Use Topics  . Alcohol use: Not Currently  . Drug use: Never      Review of Systems Unable to get review of systems due to patient's baseline dementia.   PHYSICAL EXAM:  VITAL SIGNS: Blood pressure (!) 94/43, pulse 84, resp. rate 18, weight 77.1 kg, SpO2 96 %.  Constitutional: Alert and moving around.  Does not answer questions but moving all extremities well. Eyes: Conjunctivae are normal. EOMI. Head: Atraumatic. Nose: No  congestion/rhinnorhea. Mouth/Throat: Mucous membranes are moist.  Dried blood on the face.  Patient not let me examine her tongue. Neck: No stridor. Trachea Midline. FROM Cardiovascular: Normal rate, regular rhythm. Grossly normal heart sounds.  Good peripheral circulation. Respiratory: Normal respiratory effort.  No retractions. Lungs CTAB. Gastrointestinal: Soft and nontender. No distention. No abdominal bruits.  Musculoskeletal: No lower extremity tenderness nor edema.  No joint effusions. Neurologic: Moving extremities well.  No obvious neuro deficits. Skin:  Skin is warm, dry and intact. No rash noted. Psychiatric: Agitated, unable to fully assess due to dementia and agitation GU: Deferred   PROCEDURES  Procedure(s) performed (including Critical Care):  Procedures   ____________________________________________   INITIAL IMPRESSION / ASSESSMENT AND PLAN / ED COURSE  Christine Oconnell was evaluated in Emergency Department on 04/23/2019 for the symptoms described in the history of present illness. She was evaluated in the context of the global COVID-19 pandemic, which necessitated consideration that the patient might be at risk for infection with the SARS-CoV-2 virus that causes COVID-19. Institutional protocols and algorithms that pertain to the evaluation of patients at risk for COVID-19 are in a state of rapid change based on information released by regulatory bodies including the CDC and federal and state organizations. These policies and algorithms were followed during the patient's care in the ED.    Patient is a 58 year old with dementia who presents for recurrent seizures.  The seizure is very classic of her prior seizures that she has about once per month.  She not hit her head.  She was in bed when it happened.  She is moving all her extremities therefore low suspicion for injury.  She also had labs done just a few days ago that were normal.  I discussed with patient mother who  understands that getting recurrent labs is pretty traumatic for patient and I felt that the risk of sedation and aspiration was higher than the benefit of getting these labs given the seizure seems very typical for her.  We will discuss with the neurologist to see if he has any recommendations for adjusting the medications.  The mother's main concern is that she is having a harder time taking care of her at home.  They are working on trying to get additional care at home or having her moved into a facility.  She did have a normal glucose with EMS.  We will see if we get a urine to evaluate for UTI.  Will attempt to do a good exam of her oropharynx given there was a little bit of blood on her chin to make sure there is no large tongue laceration.  Review of her vital signs show that her blood pressures slightly low although it looks similar to prior blood pressures.  Patient is afebrile and has been observed in ER for multiple hours without recurrent seizure.   Discussed with Dr. Brigitte Pulse who recommended  increasing the Depakote to 250 in the morning and 500 at night for one week and then continuing at 500 twice daily.  Started patient's home medication as well as increase in the Depakote.  OP no large tongue laceration.   Sw offered pt home health and they declined. Pt already waiting for placement options on an outpatient basis.  Mother is going to come pick her up from the ER.  She understands that if things are worsening that she can return to the ER.  ____________________________________________   FINAL CLINICAL IMPRESSION(S) / ED DIAGNOSES   Final diagnoses:  Agitation  Seizure (HCC)      MEDICATIONS GIVEN DURING THIS VISIT:  Medications  escitalopram (LEXAPRO) tablet 10 mg (10 mg Oral Refused 04/23/19 1432)  QUEtiapine (SEROQUEL) tablet 25 mg (has no administration in time range)  QUEtiapine (SEROQUEL) tablet 50 mg (has no administration in time range)  risperiDONE (RISPERDAL) tablet  0.25 mg (0.25 mg Oral Given 04/23/19 1125)  divalproex (DEPAKOTE SPRINKLE) capsule 250 mg (250 mg Oral Given 04/23/19 1124)  divalproex (DEPAKOTE SPRINKLE) capsule 500 mg (has no administration in time range)     ED Discharge Orders    None       Note:  This document was prepared using Dragon voice recognition software and may include unintentional dictation errors.   Concha SeFunke, Tangela Dolliver E, MD 04/23/19 [id number]

## 2019-04-23 NOTE — TOC Transition Note (Signed)
Transition of Care [facility]) - CM/SW Discharge Note   Patient Details  Name: Christine Oconnell MRN: [id number] Date of Birth: 02-07-1961  Transition of [facility]) CM/SW Contact:  Katrina Stack, RN Phone Number: 04/23/2019, 4:52 PM   Clinical Narrative:   CM consult for placement.  CM attempted to speak with patient but unable to engage.  Her mother is legal guardian. Patient resided at [facility] for about 4 months and demonstrated significant behavior issues.  Mother says that "I know I am going to have to put her somewhere.  She does have hired private caregivers through an agency but needs to increase time. Has an appointment at [facility] next week.  She declines to have CM set up home health RN/SW. Verbalizes understanding that patient will not be placed in a facility today. She feels comfortable that she will be evaluated by the [facility] next week.            Patient Goals and CMS Choice        Discharge Placement                       Discharge Plan and Services                                     Social Determinants of Health (SDOH) Interventions     Readmission Risk Interventions No flowsheet data found.

## 2019-04-23 NOTE — Progress Notes (Signed)
Please note patient is currently followed by [facility] Palliative at home. CMRN Joni Reining made aware.  Flo Shanks BSN, RN, [place] [phone]

## 2019-04-23 NOTE — ED Triage Notes (Signed)
Pt to ER via EMS from home with c/o seizure.  Pt has extensive hx of same.  Pt is at baseline which is uncooperative and yells out at staff.  Unable to complete full assessment due to pt cooperation.

## 2019-04-23 NOTE — ED Notes (Signed)
This RN spoke to mother, legal guardian. Mother states that she called because she had a seizure but mother is unable to care for her anymore. Mother states that she was in [place] in march but was removed due to being combative with staff and they told mother that she would need to hire a sitter if she wished to continue keeping her there. Pt has been in mother's care since then. Mother states that she has started the process with insurance and [facility] but they told her that she was unable to get long term care through them. She also has reached out to longer term care facilities but they require private pay before taking insurance.

## 2019-04-23 NOTE — ED Notes (Signed)
Pt very agitated with staff. Pt cursing and mumbling word salad at this RN, Dr. Jari Pigg, and [place] Police, RN. This RN and Lorrie, RN give pt meds in applesauce. Pt takes a lot of coaxing and firm direction before pt will take medications. Per EMS, this is her baseline. Pt refuses to keep BP or sats on. Pt lying in bed with seizure pads on in NAD.

## 2019-04-23 NOTE — ED Notes (Signed)
Pt unable to sign due to legal guardianship. Legal guardian, mother, not able to come inside to sign. D/c paperwork verbalized with mother. Mother agrees to plan of care at this time.

## 2019-05-24 ENCOUNTER — Encounter: Payer: Self-pay | Admitting: Primary Care

## 2019-05-24 ENCOUNTER — Telehealth: Payer: Self-pay | Admitting: Primary Care

## 2019-05-24 NOTE — Telephone Encounter (Signed)
T/c to set up time for palliative visit. Patient's mother updated re recent seizures. She is making good headway on care giver plan however. She states another agency called to offer hospice services. It is my assessment she is not yet within < 6 months life expectancy barring a sudden event. I will f/u with patient at her mother's request in 1 month.

## 2019-05-27 NOTE — Telephone Encounter (Signed)
Opened in error

## 2019-07-13 ENCOUNTER — Telehealth: Payer: Self-pay | Admitting: Primary Care

## 2019-07-13 NOTE — Telephone Encounter (Signed)
T/c To Mother Mrs Elwyn Reach. Christine Oconnell has gotten enrolled in [place] [facility]. She has in home aides, and has declined to be nearly bedbound. They have sent in adequate DME for her care. I explained [place] was self contained and if they wanted Palliative to continue, she could request a new referral. Otherwise I will d/c for now.

## 2019-11-03 ENCOUNTER — Other Ambulatory Visit: Payer: Self-pay

## 2019-11-03 ENCOUNTER — Encounter: Payer: Self-pay | Admitting: Emergency Medicine

## 2019-11-03 ENCOUNTER — Emergency Department: Payer: Medicare (Managed Care)

## 2019-11-03 ENCOUNTER — Emergency Department
Admission: EM | Admit: 2019-11-03 | Discharge: 2019-11-03 | Disposition: A | Discharge status: Home / Self Care | Payer: Medicare (Managed Care) | Attending: Emergency Medicine | Admitting: Emergency Medicine

## 2019-11-03 DIAGNOSIS — G309 Alzheimer's disease, unspecified: Secondary | ICD-10-CM | POA: Diagnosis not present

## 2019-11-03 DIAGNOSIS — Z79899 Other long term (current) drug therapy: Secondary | ICD-10-CM | POA: Insufficient documentation

## 2019-11-03 DIAGNOSIS — R4182 Altered mental status, unspecified: Secondary | ICD-10-CM | POA: Insufficient documentation

## 2019-11-03 DIAGNOSIS — F0280 Dementia in other diseases classified elsewhere without behavioral disturbance: Secondary | ICD-10-CM | POA: Diagnosis not present

## 2019-11-03 LAB — URINALYSIS, COMPLETE (UACMP) WITH MICROSCOPIC
Bilirubin Urine: NEGATIVE
Glucose, UA: NEGATIVE mg/dL
Ketones, ur: 5 mg/dL — AB
Nitrite: POSITIVE — AB
Protein, ur: NEGATIVE mg/dL
Specific Gravity, Urine: 1.019 (ref 1.005–1.030)
pH: 6 (ref 5.0–8.0)

## 2019-11-03 LAB — COMPREHENSIVE METABOLIC PANEL
ALT: 15 U/L (ref 0–44)
AST: 23 U/L (ref 15–41)
Albumin: 3.5 g/dL (ref 3.5–5.0)
Alkaline Phosphatase: 27 U/L — ABNORMAL LOW (ref 38–126)
Anion gap: 8 (ref 5–15)
BUN: 19 mg/dL (ref 6–20)
CO2: 28 mmol/L (ref 22–32)
Calcium: 9.7 mg/dL (ref 8.9–10.3)
Chloride: 107 mmol/L (ref 98–111)
Creatinine, Ser: 0.64 mg/dL (ref 0.44–1.00)
GFR calc Af Amer: >60 mL/min (ref >60)
GFR calc non Af Amer: >60 mL/min (ref >60)
Glucose, Bld: 91 mg/dL (ref 70–99)
Potassium: 4 mmol/L (ref 3.5–5.1)
Sodium: 143 mmol/L (ref 135–145)
Total Bilirubin: 0.6 mg/dL (ref 0.3–1.2)
Total Protein: 7.1 g/dL (ref 6.5–8.1)

## 2019-11-03 LAB — CBC
HCT: 40.4 % (ref 36.0–46.0)
Hemoglobin: 13 g/dL (ref 12.0–15.0)
MCH: 30.2 pg (ref 26.0–34.0)
MCHC: 32.2 g/dL (ref 30.0–36.0)
MCV: 93.7 fL (ref 80.0–100.0)
Platelets: 183 10*3/uL (ref 150–400)
RBC: 4.31 MIL/uL (ref 3.87–5.11)
RDW: 13.4 % (ref 11.5–15.5)
WBC: 7.4 10*3/uL (ref 4.0–10.5)
nRBC: 0 % (ref 0.0–0.2)

## 2019-11-03 LAB — VALPROIC ACID LEVEL: Valproic Acid Lvl: 53 ug/mL (ref 50.0–100.0)

## 2019-11-03 IMAGING — CT CT HEAD W/O CM
3 series · 15 of 47 positions shown, 18 images · non-contrast
Comparison: Noncontrast head CT [DATE], brain MRI [DATE]

CLINICAL DATA: Altered mental status (AMS), unclear cause.

EXAM:
CT HEAD WITHOUT CONTRAST
TECHNIQUE: Contiguous axial images were obtained from the base of the skull
through the vertex without intravenous contrast.

[Series 3: head wo · axial · 0.44mm/px · z∈[-162,-37]mm · 9 of 30 slices shown, 12 images]
[im 3/30  brain]
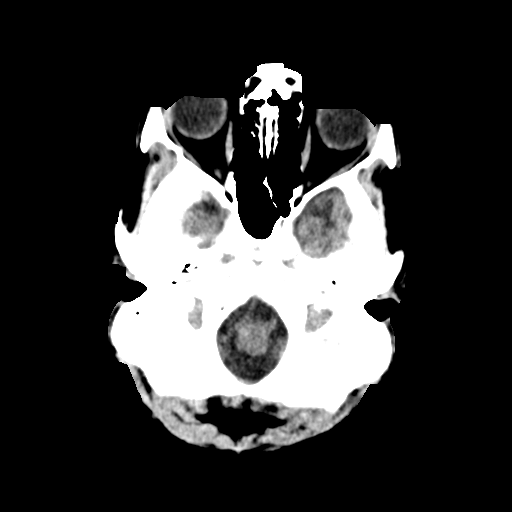
[im 3/30  bone]
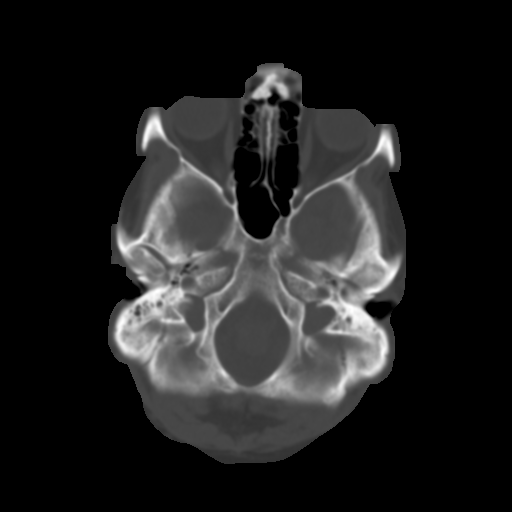
[im 6/30  brain]
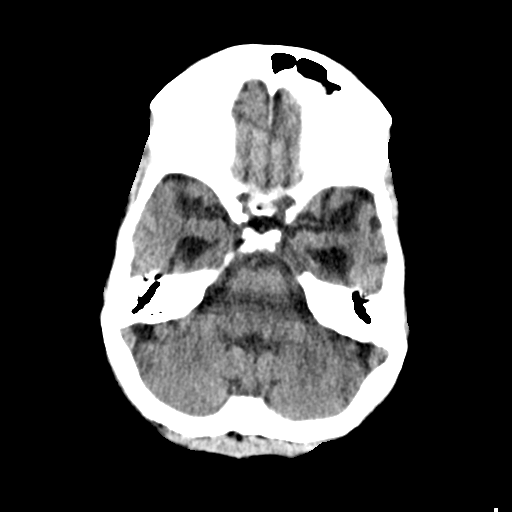
[im 9/30  brain]
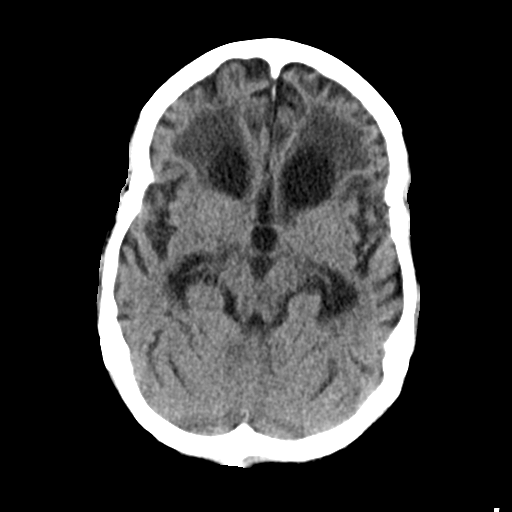
[im 12/30  brain]
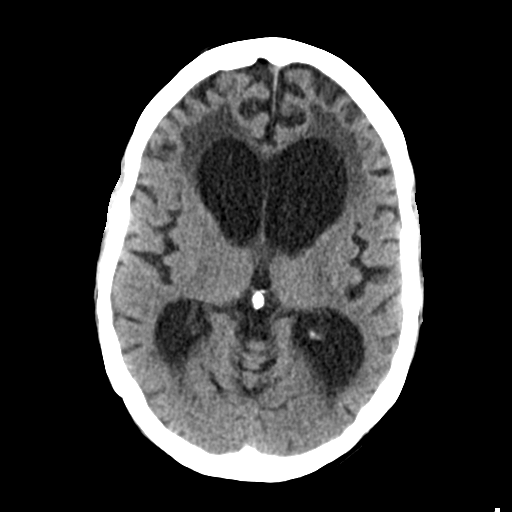
[im 16/30  brain]
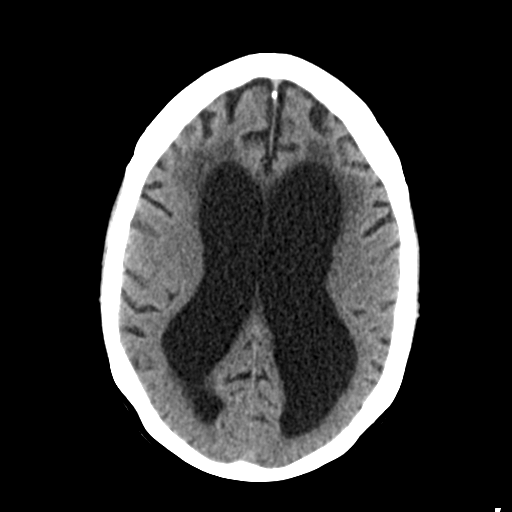
[im 16/30  bone]
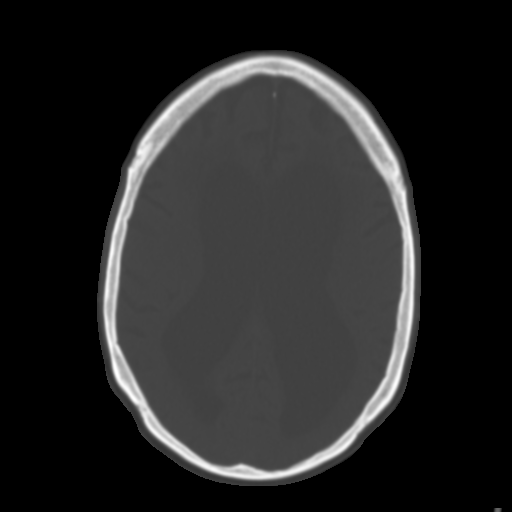
[im 19/30  brain]
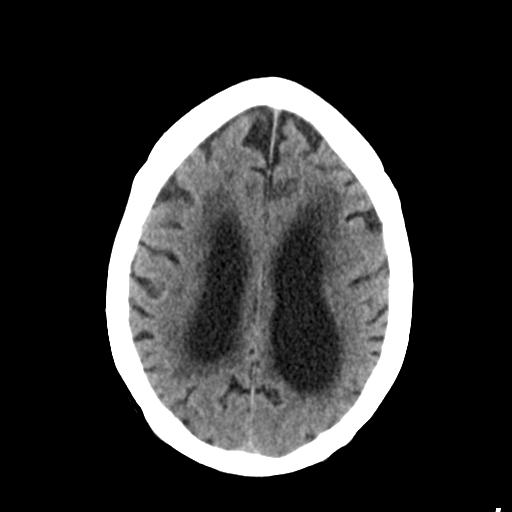
[im 22/30  brain]
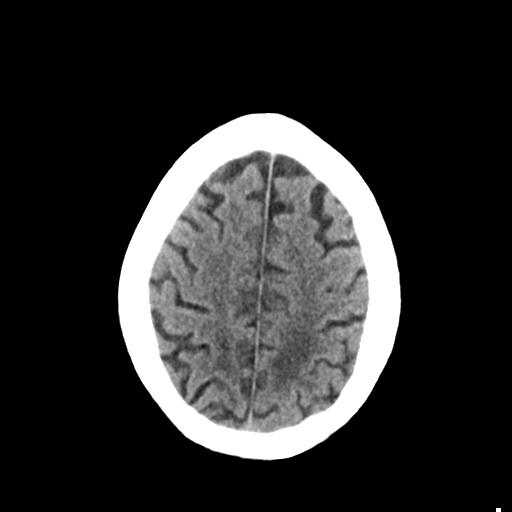
[im 25/30  brain]
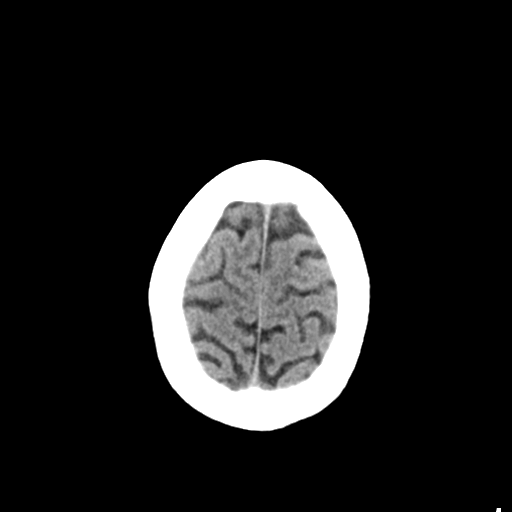
[im 28/30  brain]
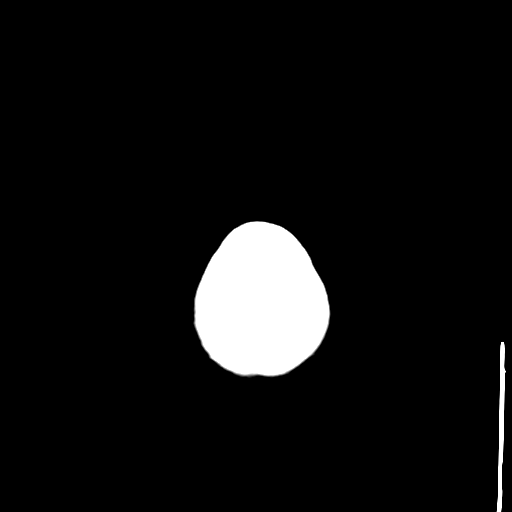
[im 28/30  bone]
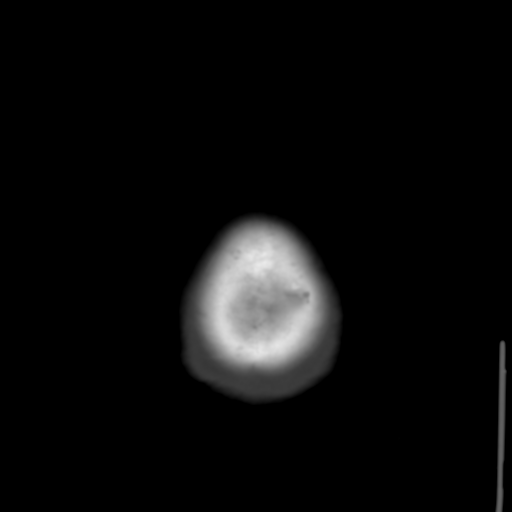

[Series 4: coronal soft tissue · coronal · 0.34mm/px · 3 of 70 slices shown]
[im 24/70  brain]
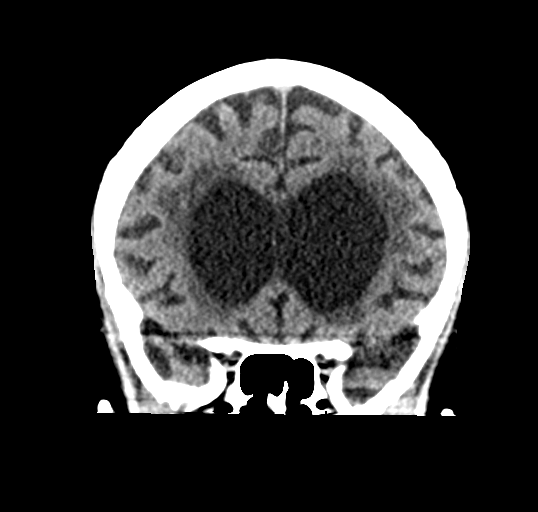
[im 31/70  brain]
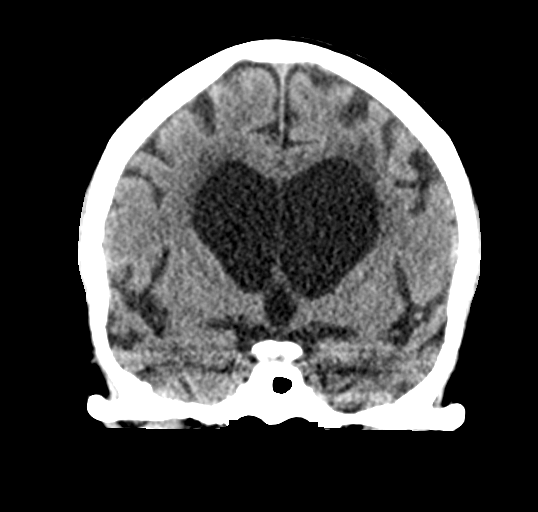
[im 39/70  brain]
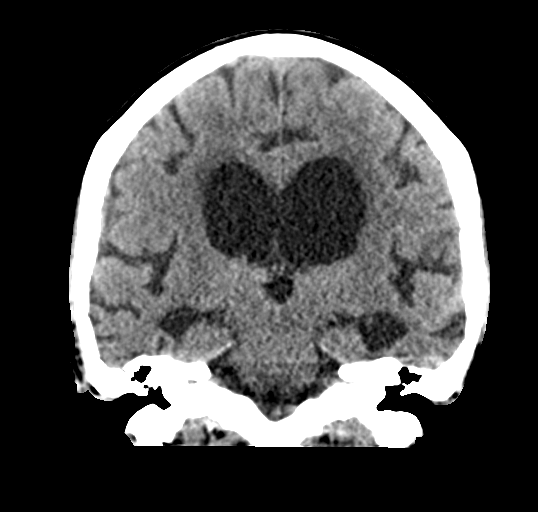

[Series 5: sagittal soft tissue · sagittal · 0.34mm/px · 3 of 53 slices shown]
[im 18/53  brain]
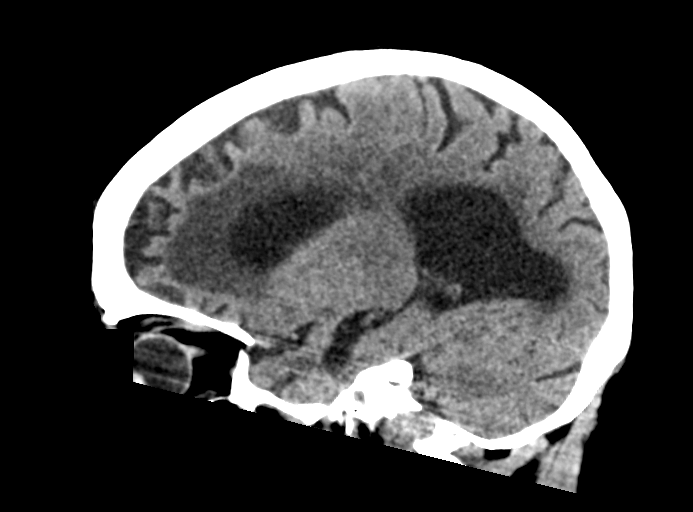
[im 27/53  brain]
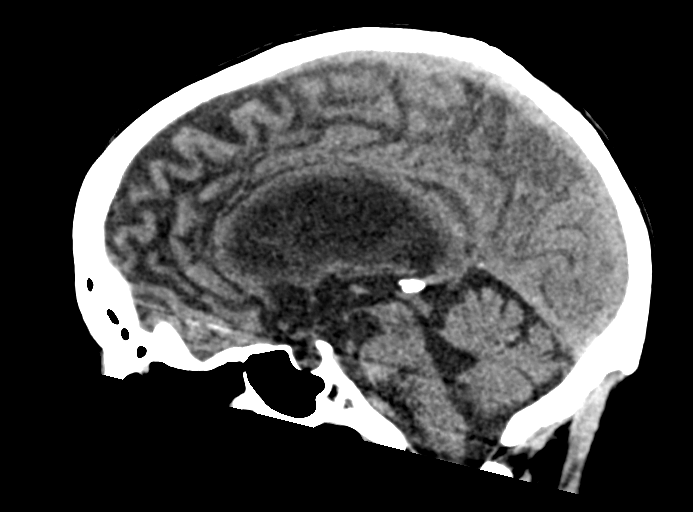
[im 35/53  brain]
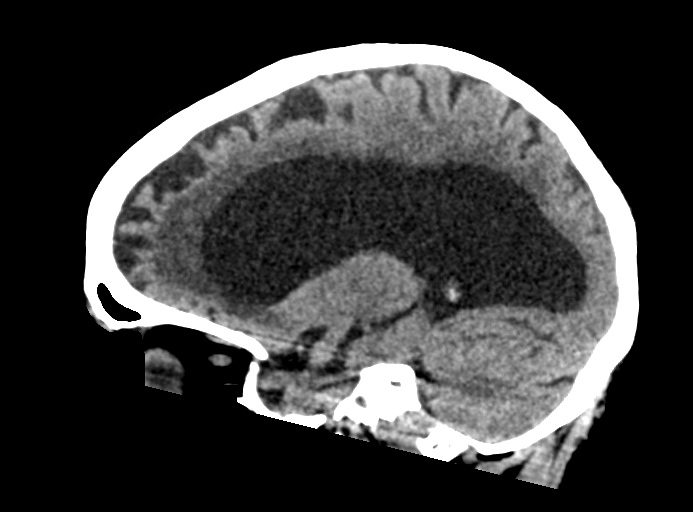

[15 of 47 positions shown; findings below may reference images not displayed]

FINDINGS: Brain:

Stable, age-advanced generalized cerebral atrophy most notable
within the frontal lobes. As before, lateral ventriculomegaly
appears somewhat out of proportion to the degree of parenchymal
atrophy and this may reflect a component of communicating/normal
pressure hydrocephalus. The size of the lateral ventricles has
increased as compared to the CT of [DATE]. The lateral and third
ventricles remain normal in caliber. Similar appearance of
periventricular hypodensity which may reflect chronic small vessel
ischemic changes and/or transependymal flow of CSF. There is no
midline shift or extra-axial fluid collection. No evidence of
intracranial mass.

Vascular: No hyperdense vessel.

Skull: Normal. Negative for fracture or focal lesion.

Sinuses/Orbits: Visualized orbits show no acute finding. No
significant paranasal sinus disease or mastoid effusion at the
imaged levels.
IMPRESSION: Again demonstrated is lateral ventriculomegaly which appears out of
proportion to the degree of age-advanced cerebral atrophy. Findings
are suspicious for communicating/normal pressure hydrocephalus. The
lateral ventricles have slightly increased in size since CT
[DATE].

Redemonstrated periventricular hypodensity which may reflect chronic
small vessel ischemic changes and/or transependymal flow of CSF.

Otherwise, there is no evidence of acute intracranial abnormality.

## 2019-11-03 MED ORDER — CEPHALEXIN 500 MG PO CAPS
500.0000 mg | ORAL_CAPSULE | Freq: Three times a day (TID) | ORAL | 0 refills | Status: AC
Start: 2019-11-03 — End: 2019-11-10

## 2019-11-03 MED ORDER — SODIUM CHLORIDE 0.9 % IV BOLUS
500.0000 mL | Freq: Once | INTRAVENOUS | Status: AC
  Administered 2019-11-03: 19:00:00 500 mL via INTRAVENOUS

## 2019-11-03 MED ORDER — CEPHALEXIN 500 MG PO CAPS
500.0000 mg | ORAL_CAPSULE | Freq: Once | ORAL | Status: AC
  Administered 2019-11-03: 500 mg via ORAL
  Filled 2019-11-03: qty 1

## 2019-11-03 MED ORDER — SODIUM CHLORIDE 0.9% FLUSH: 3.0000 mL | Freq: Once | INTRAVENOUS | Status: DC

## 2019-11-03 NOTE — ED Triage Notes (Signed)
First Nurse Note:  [facility] for c/o AMS.  Per EMS, patient is at baseline.  VS wnl.  Patient is non verbal and does not follow commands at baseline.  RR:14  SpO2 100% on RA.  CBG:  89.

## 2019-11-03 NOTE — ED Provider Notes (Signed)
Patient does have evidence of cystitis.  Will cover with Keflex.  Otherwise nontoxic no signs of sepsis.  Appropriate for outpatient follow-up.   Willy Eddy, MD 11/03/19 2211

## 2019-11-03 NOTE — ED Notes (Signed)
I&O cath done, no urine return. Pt's brief soaked.

## 2019-11-03 NOTE — ED Notes (Signed)
Pt unable to sign due to mental status.  

## 2019-11-03 NOTE — ED Triage Notes (Signed)
See first RN Note, per first RN EMS reports that patient was gazing off and was "not her normal". Pt appears to be somnolent upon arrival to ED.

## 2019-11-03 NOTE — ED Notes (Signed)
Pt's mother/legal guardian notified of discharge back to [facility] and plan of care. Report also called to [facility] at [facility]

## 2019-11-03 NOTE — ED Notes (Signed)
Spoke to pt's mother (legal guardian) who states pt is typically able to say a few random nonsense words at baseline but id not able to answer questions or hold a conversation. Mother notified of plan of care. States pt is temporarily staying at [facility] at [place] until this Friday for respite care, pt usually lives at home with mother.

## 2019-11-03 NOTE — ED Provider Notes (Signed)
[facility] Emergency Department Provider Note  Time seen: 4:02 PM  I have reviewed the triage vital signs and the nursing notes.   HISTORY  Chief Complaint Altered Mental Status   HPI Christine Oconnell is a 59 y.o. female with a past medical history of dementia, seizures, hyperlipidemia, presents to the emergency department for possible seizure.  According to patient's [facility] provider, was at her daycare center, while at lunch she had an abrupt change in her mental status.  They states she became somnolent and seemed more confused and somnolent possibly indicating a postictal state.  They deny any seizure-like activity denies any choking.  Does not appear that the patient lost consciousness.  Here the patient is awake, she cannot answer questions or follow commands.  She is looking around the room awake does not appear to be in any discomfort.  Per report this is her baseline.   Past Medical History:  Diagnosis Date  . Alzheimer's dementia ([place])   . Hyperlipidemia   . Seizures [facility])     Patient Active Problem List   Diagnosis Date Noted  . Early onset Alzheimer's disease with behavioral disturbance ([place]) 09/19/2018  . Alzheimer disease ([place]) 09/19/2018  . Aggressive behavior   . Hyperlipidemia 10/11/2013    Past Surgical History:  Procedure Laterality Date  . ABDOMINAL HYSTERECTOMY      Prior to Admission medications   Medication Sig Start Date End Date Taking? Authorizing Provider  divalproex (DEPAKOTE SPRINKLE) 125 MG capsule Take 2 capsules (250 mg total) by mouth 2 (two) times daily. Increase to 250mg  in AM and 500mg  (two pills) in PM for one week. Then increase to 500mg  in AM and 500mg  in PM. 04/23/19 05/23/19  Vanessa [place], MD  escitalopram (LEXAPRO) 10 MG tablet Take 10 mg by mouth daily.  07/07/18   [provider]  levETIRAcetam (KEPPRA) 500 MG tablet Take 1 tablet (500 mg total) by mouth 2 (two) times daily. Patient not taking: Reported on  01/26/2019 07/26/18   Darel Hong, MD  lovastatin (MEVACOR) 20 MG tablet Take 20 mg by mouth daily.     [provider]  polyethylene glycol powder (GLYCOLAX/MIRALAX) 17 GM/SCOOP powder Take 17 g by mouth daily as needed for moderate constipation. 02/04/19   Harvest Dark, MD  QUEtiapine (SEROQUEL) 25 MG tablet Take 25 mg by mouth daily as needed (agitation).    [provider]  QUEtiapine (SEROQUEL) 50 MG tablet Take 50 mg by mouth at bedtime.     [provider]  risperiDONE (RISPERDAL) 0.25 MG tablet Take 0.25 mg by mouth daily as needed (agitation).     [provider]  vitamin B-12 (CYANOCOBALAMIN) 1000 MCG tablet Take 3,000 mcg by mouth daily.    [provider]    No Known Allergies  Family History  Problem Relation Age of Onset  . Breast cancer Maternal Grandmother 85       mat great gm  . Breast cancer Cousin 59    Social History Social History   Tobacco Use  . Smoking status: Unknown If Ever Smoked  . Smokeless tobacco: Never Used  Substance Use Topics  . Alcohol use: Not Currently  . Drug use: Never    Review of Systems Unable to obtain adequate/accurate review of systems secondary to severe baseline dementia. ____________________________________________   PHYSICAL EXAM:  VITAL SIGNS: ED Triage Vitals  Enc Vitals Group     BP 11/03/19 1402 110/62     Pulse  Rate 11/03/19 1402 91     Resp 11/03/19 1402 16     Temp 11/03/19 1402 97.8 F (36.6 C)     Temp Source 11/03/19 1402 Oral     SpO2 11/03/19 1402 98 %     Weight 11/03/19 1418 180 lb (81.6 kg)     Height 11/03/19 1418 5\' 6"  (1.676 m)     Head Circumference --      Peak Flow --      Pain Score --      Pain Loc --      Pain Edu? --      Excl. in GC? --    Constitutional: Patient is awake.  Does not answer questions or follow commands. Eyes: Normal exam ENT      Head: Normocephalic and atraumatic.      Mouth/Throat: Mucous membranes are  moist. Cardiovascular: Normal rate, regular rhythm.  Respiratory: Normal respiratory effort without tachypnea nor retractions. Breath sounds are clear  Gastrointestinal: Soft and nontender. No distention.   Musculoskeletal: Lower extremities are in boots to prevent ulcerations. Neurologic: Patient does not speak.  Cannot perform adequate neurological exam due to advanced dementia. Skin:  Skin is warm, dry  Psychiatric: Mood and affect are normal.  ____________________________________________    EKG  EKG viewed and interpreted by myself shows a normal sinus rhythm at 68 bpm with a narrow QRS, normal axis, normal intervals, nonspecific ST changes.  ____________________________________________    RADIOLOGY  CT shows possible hydrocephalus.  No acute abnormality noted.  ____________________________________________   INITIAL IMPRESSION / ASSESSMENT AND PLAN / ED COURSE  Pertinent labs & imaging results that were available during my care of the patient were reviewed by me and considered in my medical decision making (see chart for details).   Patient presents to the emergency department for altered mental status, which appears to have resolved.  Patient does have a history of seizures, possibly had a seizure earlier today.  Differential would also include CVA, brain mass or tumor, metabolic or electrolyte abnormality or infectious etiology.  We will check labs, urinalysis, CT scan of the head continue to closely monitor.  I spoke to the patient's PCP [facility].  They are requesting we get a urinalysis sample as well.  Patient's Depakote level has resulted at 53.  Work-up is normal.  Advanced changes on the CT but no acute abnormality.  Once urinalysis has resulted I anticipate likely discharge home.  Patient care signed out to oncoming physician.  Christine Oconnell was evaluated in Emergency Department on 11/03/2019 for the symptoms described in the history of present illness. She was evaluated  in the context of the global COVID-19 pandemic, which necessitated consideration that the patient might be at risk for infection with the SARS-CoV-2 virus that causes COVID-19. Institutional protocols and algorithms that pertain to the evaluation of patients at risk for COVID-19 are in a state of rapid change based on information released by regulatory bodies including the CDC and federal and state organizations. These policies and algorithms were followed during the patient's care in the ED.  ____________________________________________   FINAL CLINICAL IMPRESSION(S) / ED DIAGNOSES  Altered mental status   11/05/2019, MD 11/03/19 2037

## 2019-11-04 LAB — GLUCOSE, CAPILLARY: Glucose-Capillary: 80 mg/dL (ref 70–99)
